# Patient Record
Sex: Female | Born: 1990 | Race: Black or African American | Hispanic: No | Marital: Single | Smoking: Never smoker
Health system: Southern US, Community
[De-identification: ages and names within clinical notes are randomized; demographics above are authoritative.]

## PROBLEM LIST (undated history)

## (undated) HISTORY — PX: TONSILLECTOMY: SUR1361

---
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
## 2021-01-16 ENCOUNTER — Inpatient Hospital Stay (HOSPITAL_COMMUNITY)
Admission: EM | Admit: 2021-01-16 | Discharge: 2021-01-28 | DRG: 060 | Disposition: A | Discharge status: Home / Self Care | Payer: Self-pay | Attending: Internal Medicine | Admitting: Internal Medicine

## 2021-01-16 DIAGNOSIS — L0201 Cutaneous abscess of face: Secondary | ICD-10-CM

## 2021-01-16 DIAGNOSIS — R0602 Shortness of breath: Secondary | ICD-10-CM

## 2021-01-16 DIAGNOSIS — E538 Deficiency of other specified B group vitamins: Secondary | ICD-10-CM | POA: Diagnosis present

## 2021-01-16 DIAGNOSIS — F419 Anxiety disorder, unspecified: Secondary | ICD-10-CM | POA: Diagnosis present

## 2021-01-16 DIAGNOSIS — K0889 Other specified disorders of teeth and supporting structures: Secondary | ICD-10-CM

## 2021-01-16 DIAGNOSIS — E559 Vitamin D deficiency, unspecified: Secondary | ICD-10-CM | POA: Diagnosis present

## 2021-01-16 DIAGNOSIS — Z880 Allergy status to penicillin: Secondary | ICD-10-CM

## 2021-01-16 DIAGNOSIS — G35 Multiple sclerosis: Secondary | ICD-10-CM

## 2021-01-16 DIAGNOSIS — Z87440 Personal history of urinary (tract) infections: Secondary | ICD-10-CM

## 2021-01-16 DIAGNOSIS — R300 Dysuria: Secondary | ICD-10-CM | POA: Diagnosis present

## 2021-01-16 DIAGNOSIS — R350 Frequency of micturition: Secondary | ICD-10-CM | POA: Diagnosis present

## 2021-01-16 DIAGNOSIS — H469 Unspecified optic neuritis: Secondary | ICD-10-CM | POA: Diagnosis present

## 2021-01-16 DIAGNOSIS — Z820 Family history of epilepsy and other diseases of the nervous system: Secondary | ICD-10-CM

## 2021-01-16 DIAGNOSIS — G360 Neuromyelitis optica [Devic]: Principal | ICD-10-CM | POA: Diagnosis present

## 2021-01-16 DIAGNOSIS — M79651 Pain in right thigh: Secondary | ICD-10-CM | POA: Diagnosis present

## 2021-01-16 DIAGNOSIS — Z20822 Contact with and (suspected) exposure to covid-19: Secondary | ICD-10-CM | POA: Diagnosis present

## 2021-01-16 DIAGNOSIS — J029 Acute pharyngitis, unspecified: Secondary | ICD-10-CM | POA: Diagnosis present

## 2021-01-16 DIAGNOSIS — Z114 Encounter for screening for human immunodeficiency virus [HIV]: Secondary | ICD-10-CM

## 2021-01-16 LAB — CBC WITH DIFFERENTIAL/PLATELET
Abs Immature Granulocytes: 0.01 10*3/uL (ref 0.00–0.07)
Basophils Absolute: 0 10*3/uL (ref 0.0–0.1)
Basophils Relative: 1 %
Eosinophils Absolute: 0.1 10*3/uL (ref 0.0–0.5)
Eosinophils Relative: 3 %
HCT: 41.2 % (ref 36.0–46.0)
Hemoglobin: 12.6 g/dL (ref 12.0–15.0)
Immature Granulocytes: 0 %
Lymphocytes Relative: 44 %
Lymphs Abs: 2 10*3/uL (ref 0.7–4.0)
MCH: 25.7 pg — ABNORMAL LOW (ref 26.0–34.0)
MCHC: 30.6 g/dL (ref 30.0–36.0)
MCV: 84.1 fL (ref 80.0–100.0)
Monocytes Absolute: 0.3 10*3/uL (ref 0.1–1.0)
Monocytes Relative: 7 %
Neutro Abs: 2 10*3/uL (ref 1.7–7.7)
Neutrophils Relative %: 45 %
Platelets: 223 10*3/uL (ref 150–400)
RBC: 4.9 MIL/uL (ref 3.87–5.11)
RDW: 13.2 % (ref 11.5–15.5)
WBC: 4.4 10*3/uL (ref 4.0–10.5)
nRBC: 0 % (ref 0.0–0.2)

## 2021-01-16 LAB — COMPREHENSIVE METABOLIC PANEL
ALT: 21 U/L (ref 0–44)
AST: 28 U/L (ref 15–41)
Albumin: 4.2 g/dL (ref 3.5–5.0)
Alkaline Phosphatase: 30 U/L — ABNORMAL LOW (ref 38–126)
Anion gap: 8 (ref 5–15)
BUN: 8 mg/dL (ref 6–20)
CO2: 26 mmol/L (ref 22–32)
Calcium: 9.7 mg/dL (ref 8.9–10.3)
Chloride: 104 mmol/L (ref 98–111)
Creatinine, Ser: 1.1 mg/dL — ABNORMAL HIGH (ref 0.44–1.00)
GFR, Estimated: >60 mL/min (ref >60)
Glucose, Bld: 97 mg/dL (ref 70–99)
Potassium: 3.7 mmol/L (ref 3.5–5.1)
Sodium: 138 mmol/L (ref 135–145)
Total Bilirubin: 1.1 mg/dL (ref 0.3–1.2)
Total Protein: 7.7 g/dL (ref 6.5–8.1)

## 2021-01-16 LAB — I-STAT BETA HCG BLOOD, ED (MC, WL, AP ONLY): I-stat hCG, quantitative: <5 m[IU]/mL (ref <5)

## 2021-01-16 MED ORDER — LORAZEPAM 2 MG/ML IJ SOLN
1.0000 mg | Freq: Once | INTRAMUSCULAR | Status: AC
  Administered 2021-01-17: 1 mg via INTRAVENOUS
  Filled 2021-01-16: qty 1

## 2021-01-16 MED ORDER — TETRACAINE HCL 0.5 % OP SOLN: 2.0000 [drp] | Freq: Once | OPHTHALMIC | Status: DC

## 2021-01-16 NOTE — ED Triage Notes (Signed)
Pt states she has had change in vision in right eye and saw a optometrist who suggested MRI . Has gotten worse today and has pain

## 2021-01-16 NOTE — ED Provider Notes (Signed)
Emergency Medicine Provider Triage Evaluation Note  Danielle York , a 30 y.o. female  was evaluated in triage.  Pt complains of right eye pain and vision loss.  Symptoms began a few days ago.  Saw optometry yesterday who was concerned about optic neuritis, recommended ER evaluation with an MRI.  Today the patient is worse and pain is increased.  No symptoms of the left eye.  No numbness or weakness of the face or extremities.  Associated headache.  No other medical problems.  Review of Systems  Positive: R eye pain and vision loss, HA Negative: Weakness  Physical Exam  BP (!) 158/108 (BP Location: Left Arm)   Pulse (!) 108   Temp 100 F (37.8 C) (Oral)   Resp 20   SpO2 96%  Gen:   Awake, no distress   Resp:  Normal effort  MSK:   Moves extremities without difficulty  Other:  Reports cental vision loss. EOMI and PERRLA  Medical Decision Making  Medically screening exam initiated at 9:43 PM.  Appropriate orders placed.  Danielle York was informed that the remainder of the evaluation will be completed by another provider, this initial triage assessment does not replace that evaluation, and the importance of remaining in the ED until their evaluation is complete.  Labs and MRI ordered   Danielle Apley, PA-C 01/16/21 2146    Danielle Savoy, MD 01/16/21 [phone]

## 2021-01-17 ENCOUNTER — Encounter (HOSPITAL_COMMUNITY): Payer: Self-pay | Admitting: Internal Medicine

## 2021-01-17 ENCOUNTER — Emergency Department (HOSPITAL_COMMUNITY): Payer: Self-pay

## 2021-01-17 ENCOUNTER — Other Ambulatory Visit: Payer: Self-pay

## 2021-01-17 ENCOUNTER — Inpatient Hospital Stay (HOSPITAL_COMMUNITY): Payer: Self-pay

## 2021-01-17 DIAGNOSIS — H53131 Sudden visual loss, right eye: Secondary | ICD-10-CM

## 2021-01-17 DIAGNOSIS — R300 Dysuria: Secondary | ICD-10-CM

## 2021-01-17 DIAGNOSIS — H5711 Ocular pain, right eye: Secondary | ICD-10-CM

## 2021-01-17 DIAGNOSIS — H469 Unspecified optic neuritis: Secondary | ICD-10-CM

## 2021-01-17 LAB — VITAMIN B12: Vitamin B-12: 408 pg/mL (ref 180–914)

## 2021-01-17 LAB — SARS CORONAVIRUS 2 (TAT 6-24 HRS): SARS Coronavirus 2: NEGATIVE

## 2021-01-17 LAB — HIV ANTIBODY (ROUTINE TESTING W REFLEX): HIV Screen 4th Generation wRfx: NONREACTIVE

## 2021-01-17 IMAGING — MR MR ORBITS WO/W CM
5 of 8 series · 24 of 48 positions shown · IV contrast (gadavist)
Comparison: None available.

CLINICAL DATA: Initial evaluation for acute right-sided visual
disturbance, suspected optic neuritis.

EXAM:
MRI HEAD AND ORBITS WITHOUT AND WITH CONTRAST
TECHNIQUE: Multiplanar, multiecho pulse sequences of the brain and surrounding
structures were obtained without and with intravenous contrast.
Multiplanar, multiecho pulse sequences of the orbits and surrounding
structures were obtained including fat saturation techniques, before
and after intravenous contrast administration.
CONTRAST:  5mL GADAVIST GADOBUTROL 1 MMOL/ML IV SOLN

[Series 1: T1 · axial · non-contrast · 3.0mm · 0.37mm/px · z∈[-143,-91]mm · 4 of 15 slices shown (1 of 2)]
[im 1/15]
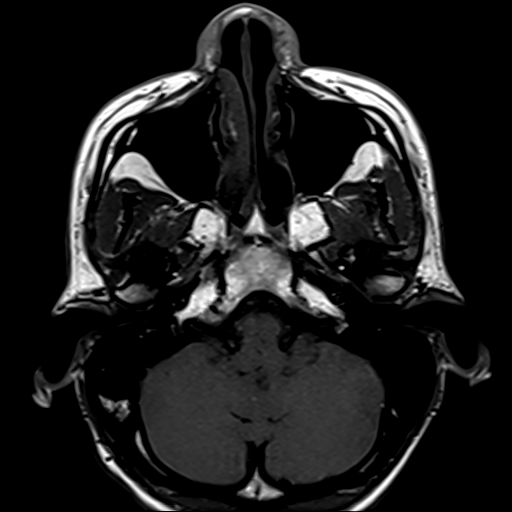
[im 5/15]
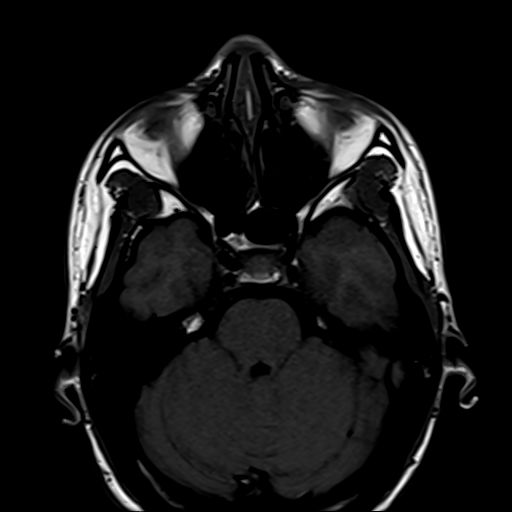
[im 10/15]
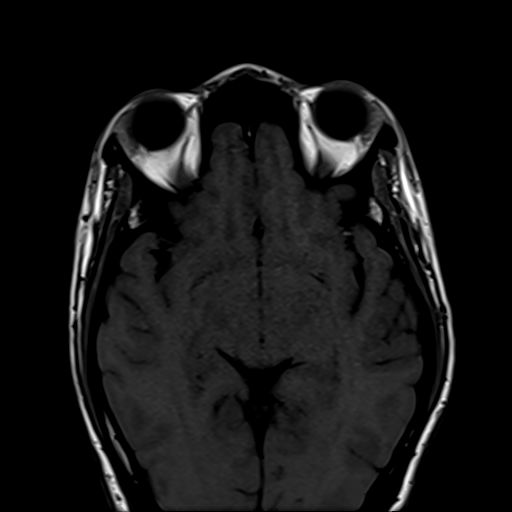
[im 15/15]
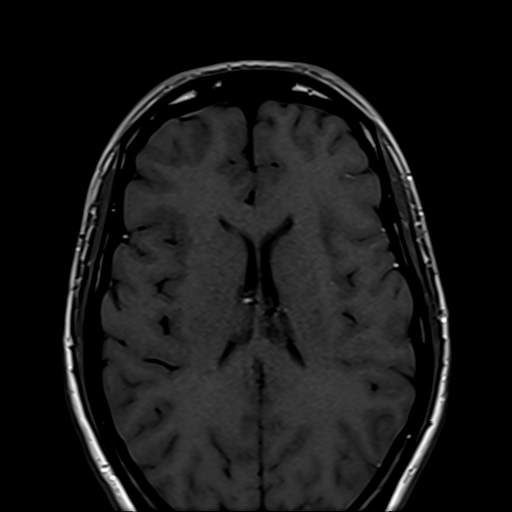

[Series 2: T2 fat-sat · axial · 3.0mm · 0.59mm/px · z∈[-143,-91]mm · 4 of 15 slices shown (1 of 2)]
[im 1/15]
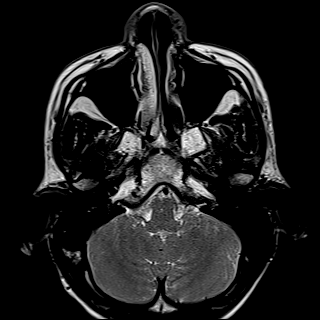
[im 5/15]
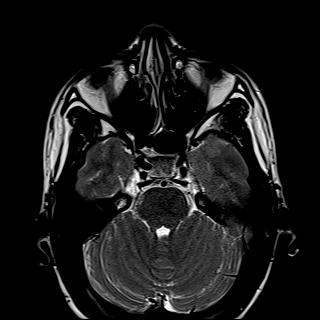
[im 10/15]
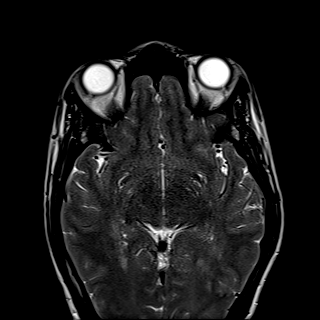
[im 15/15]
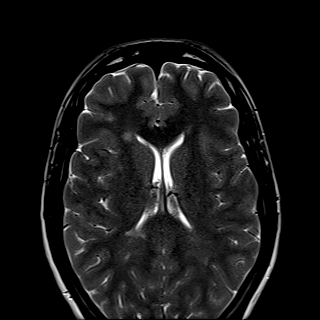

[Series 5: T2 fat-sat · coronal · 3.0mm · 0.74mm/px · 6 of 27 slices shown (2 of 2)]
[im 1/27]
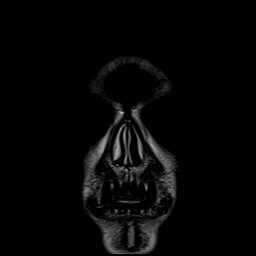
[im 6/27]
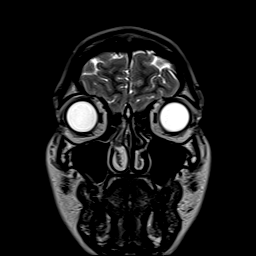
[im 11/27]
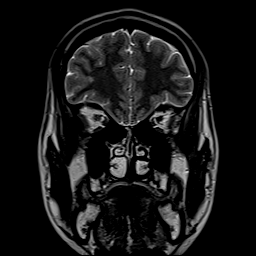
[im 16/27]
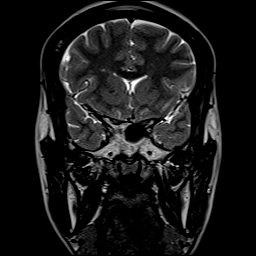
[im 21/27]
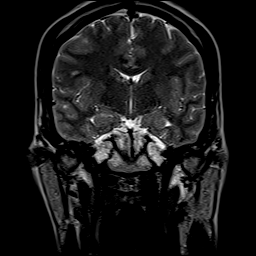
[im 27/27]
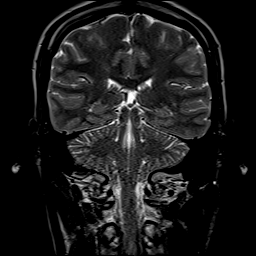

[Series 8: T1 · coronal · 3.0mm · 0.37mm/px · 3 of 27 slices shown (2 of 2)]
[im 1/27]
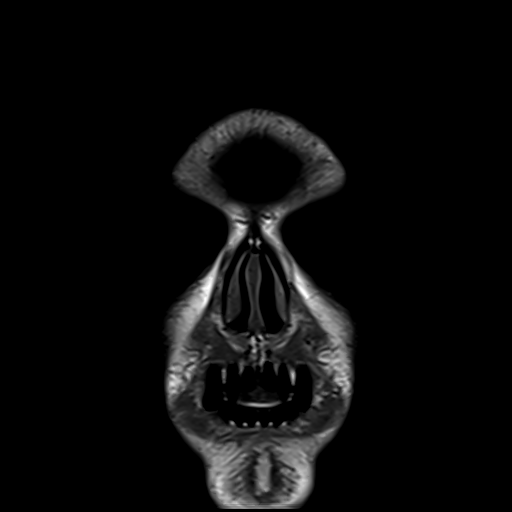
[im 6/27]
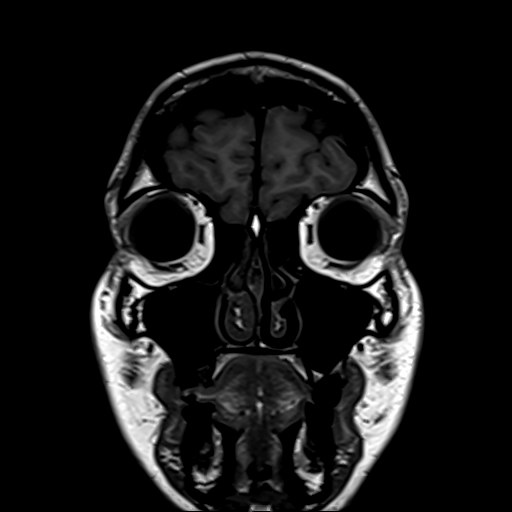
[im 11/27]
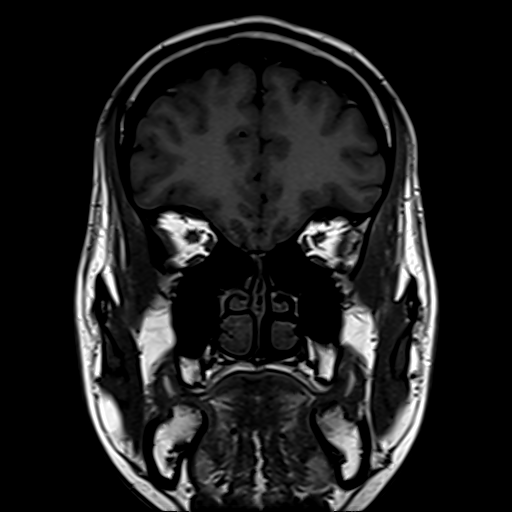

[Series 12: T1 post-contrast · coronal · 5.0mm · 0.43mm/px · 7 of 28 slices shown]
[im 1/28]
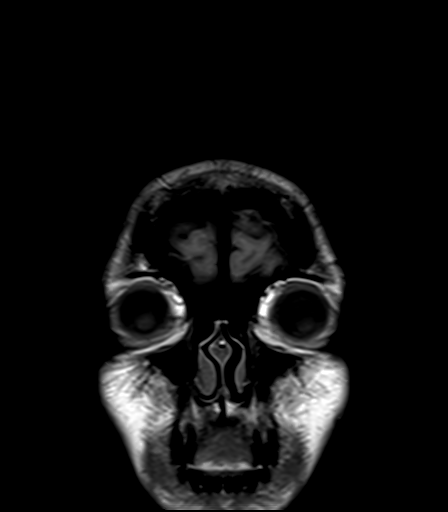
[im 5/28]
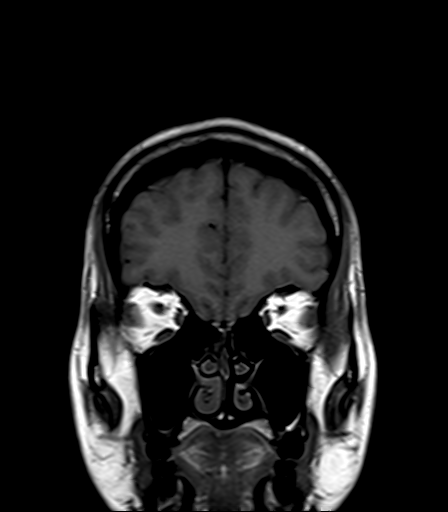
[im 10/28]
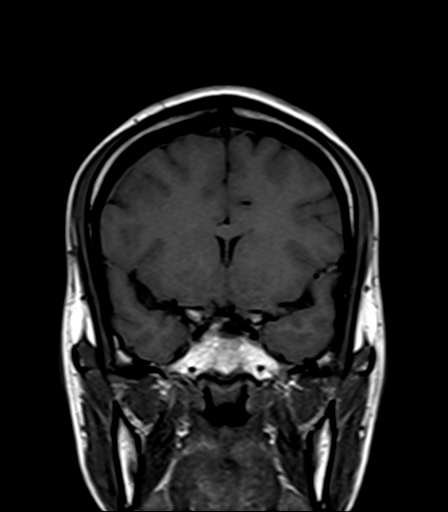
[im 14/28]
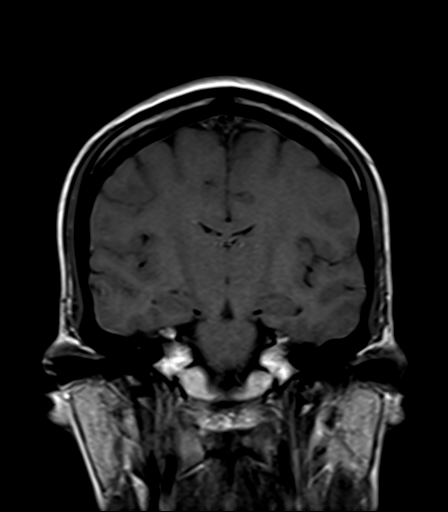
[im 19/28]
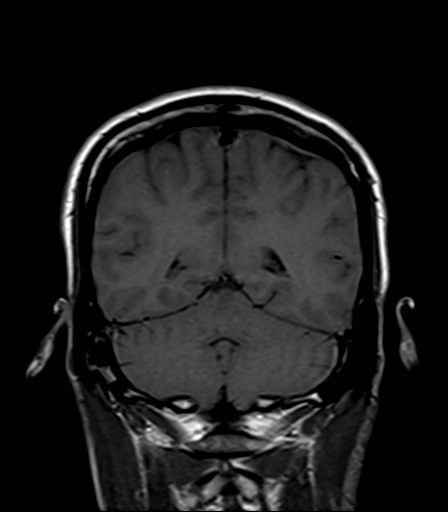
[im 23/28]
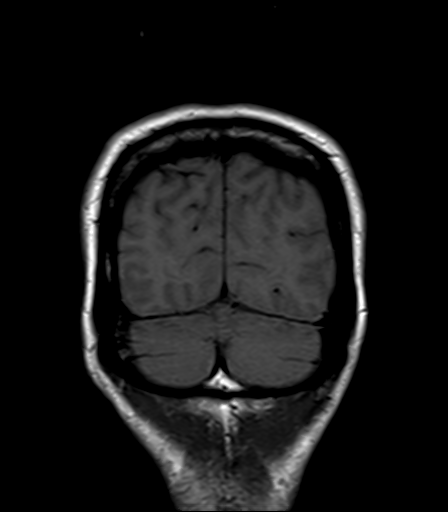
[im 28/28]
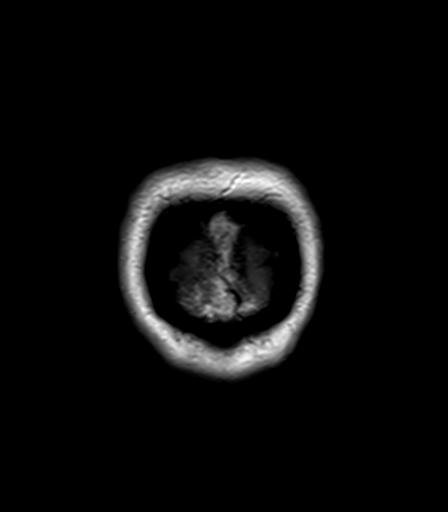

[24 of 48 positions shown; findings below may reference images not displayed]

FINDINGS: MRI HEAD FINDINGS

Brain: Examination is somewhat technically limited as while the
patient did receive IV contrast material for this exam, little to no
contrast material is seen within the brain on postcontrast
sequences, limiting assessment.

Cerebral volume within normal limits. There are scattered patchy
T2/FLAIR hyperintensities involving the periventricular and deep
white matter of both cerebral hemispheres. A few probable scattered
subtle juxtacortical lesions noted as well. Several of these foci
radiate from the lateral ventricles in a perpendicular fashion. A
few infratentorial lesions involving the left dorsal midbrain and
right pons noted (series 12, images 9, 7). For reference purposes,
the largest discrete lesion seen at the deep white matter of the
right frontal centrum semi ovale and measures 1 cm (series 12, image
15). Multiple corresponding T1 black holes. Findings are most
consistent with demyelinating disease/multiple sclerosis. Several of
these lesions demonstrate associated T2 shine through, with no
definite restricted diffusion to suggest active demyelination.
Again, evaluation for associated enhancement is fairly limited on
this exam as little to no contrast material is on board for
postcontrast sequences.

No evidence for acute or subacute infarct. Gray-white matter
differentiation maintained. No encephalomalacia to suggest chronic
cortical infarction. No foci of susceptibility artifact to suggest
acute or chronic intracranial hemorrhage.

No mass lesion, midline shift or mass effect. No hydrocephalus or
extra-axial fluid collection. Pituitary gland suprasellar region
normal. Midline structures intact.

Vascular: Major intracranial vascular flow voids are maintained.

Skull and upper cervical spine: Craniocervical junction within
normal limits. Bone marrow signal intensity normal. No scalp soft
tissue abnormality.

Other: No mastoid effusion.  Inner ear structures grossly normal.

MRI ORBITS FINDINGS

Orbits: Globes are symmetric in size with normal morphology
bilaterally. While evaluation for possible optic neuritis is limited
given the relative lack of IV contrast on this exam, there is subtle
asymmetric enlargement of the right optic nerve as compared to the
left. Additionally, the nerve and/or nerve sheath itself appears
somewhat fuzzy and irregular as compared to the contralateral left
nerve (series 2, image 8). Findings also seen on corresponding
coronal sequence (series 5, image 18). Given the patient history and
corresponding findings in the brain, findings are highly suspicious
for acute optic neuritis. No abnormality seen about the orbital
apices. Optic chiasm normally situated within the suprasellar
cistern without abnormality. No sellar or suprasellar mass.

Extra-ocular muscles symmetric and within normal limits. Lacrimal
glands normal. Superior orbital veins symmetric and within normal
limits. No abnormality about the cavernous sinus.

Visualized sinuses: Visualized paranasal sinuses are clear.

Soft tissues: Unremarkable.
IMPRESSION: 1. Technically limited exam as while contrast was administered for
this study, little to no contrast is seen onboard on postcontrast
sequences, somewhat limiting assessment.
2. Patchy T2/FLAIR hyperintensities involving the supratentorial and
infratentorial cerebral white matter as above, most consistent with
demyelinating disease/multiple sclerosis. No definite evidence for
active demyelination on this limited study.
3. Subtle asymmetric enlargement and irregularity of the right optic
nerve as above, highly suspicious for acute optic neuritis,
particularly given the patient history and concomitant findings
within the brain.

## 2021-01-17 IMAGING — MR MR HEAD WO/W CM
12 of 16 series · 31 of 48 positions shown · IV contrast (gadavist)
Comparison: None available.

CLINICAL DATA: Initial evaluation for acute right-sided visual
disturbance, suspected optic neuritis.

EXAM:
MRI HEAD AND ORBITS WITHOUT AND WITH CONTRAST
TECHNIQUE: Multiplanar, multiecho pulse sequences of the brain and surrounding
structures were obtained without and with intravenous contrast.
Multiplanar, multiecho pulse sequences of the orbits and surrounding
structures were obtained including fat saturation techniques, before
and after intravenous contrast administration.
CONTRAST:  5mL GADAVIST GADOBUTROL 1 MMOL/ML IV SOLN

[Series 5: DWI · axial · 3.0mm · 0.88mm/px · z∈[-151,-21]mm · 2 of 48 slices shown (1 of 4)]
[im 1/48]
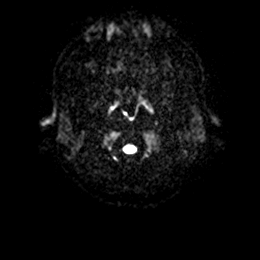
[im 48/48]
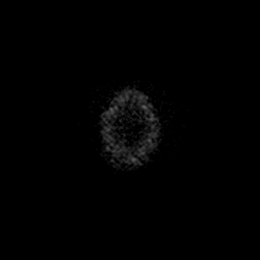

[Series 6: DWI · axial · 3.0mm · 0.88mm/px · z∈[-151,-21]mm · 2 of 48 slices shown (2 of 4)]
[im 1/48]
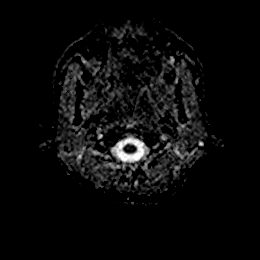
[im 48/48]
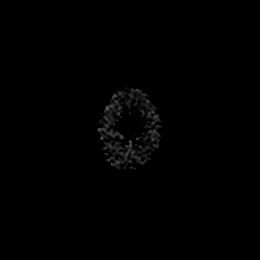

[Series 7: DWI · coronal · 4.0mm · 0.88mm/px · 2 of 36 slices shown (3 of 4)]
[im 1/36]
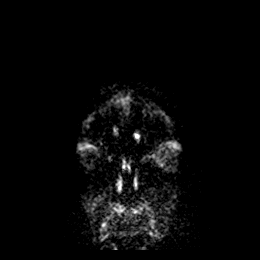
[im 36/36]
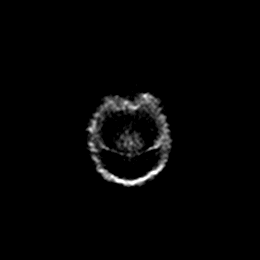

[Series 8: DWI · coronal · 4.0mm · 0.88mm/px · 2 of 36 slices shown (4 of 4)]
[im 1/36]
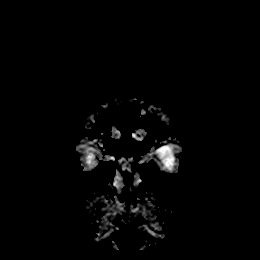
[im 36/36]
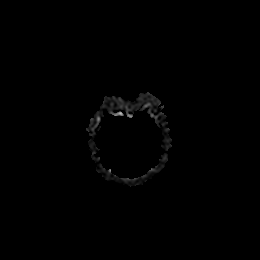

[Series 9: T1 · sagittal · 5.0mm · 0.94mm/px · 2 of 23 slices shown]
[im 1/23]
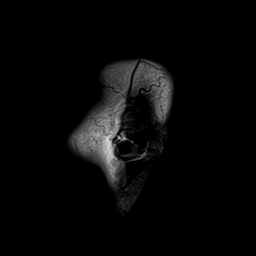
[im 23/23]
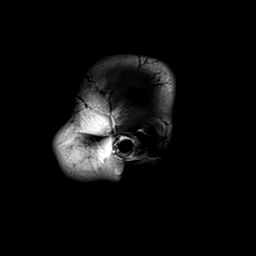

[Series 10: t2_space_dark-[id number]_ns-ir · sagittal · 1.0mm · 0.49mm/px · 8 of 128 slices shown]
[im 1/128]
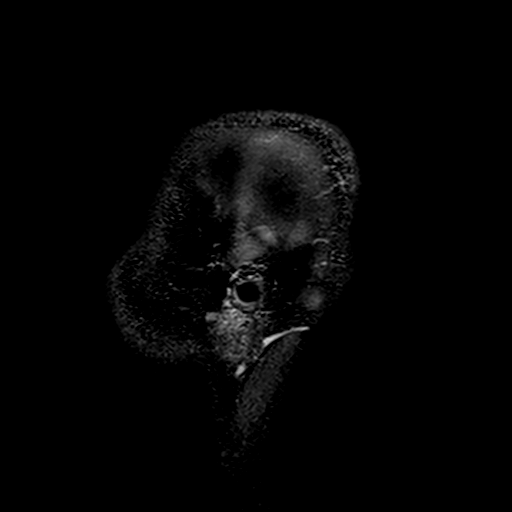
[im 16/128]
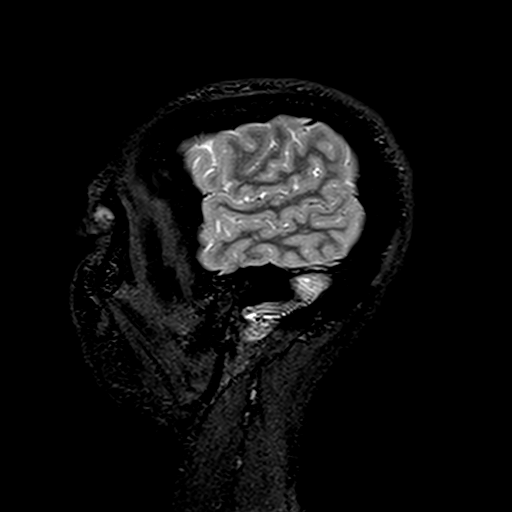
[im 32/128]
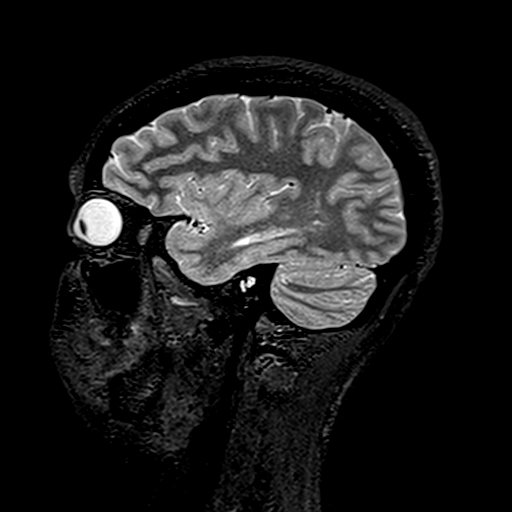
[im 48/128]
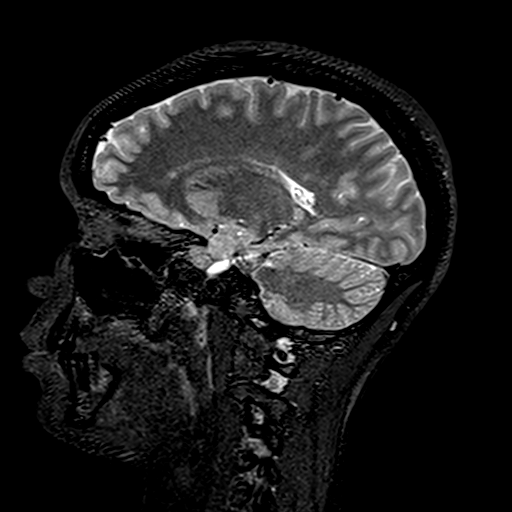
[im 80/128]
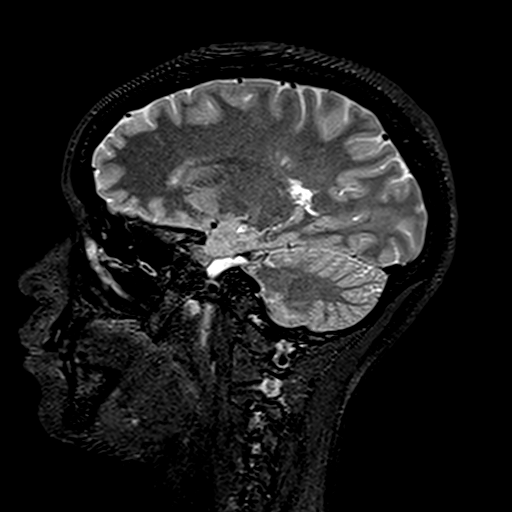
[im 96/128]
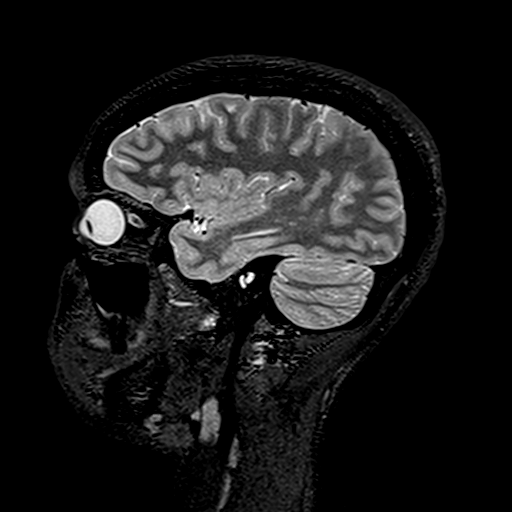
[im 112/128]
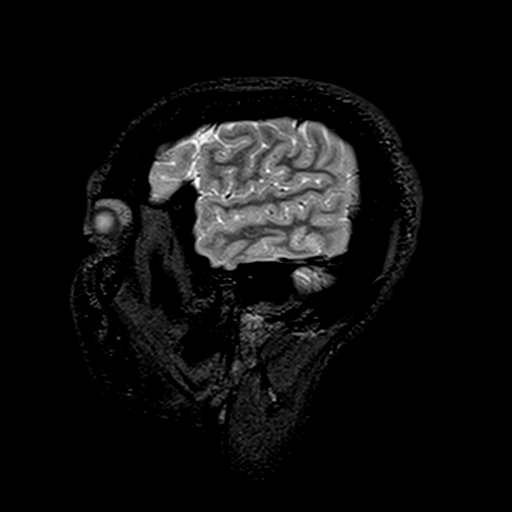
[im 128/128]
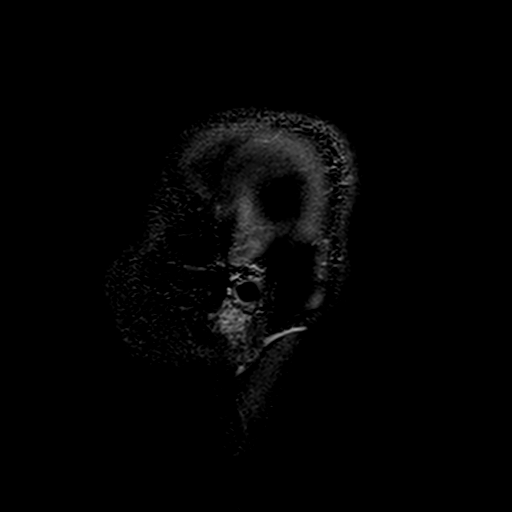

[Series 11: T2 · axial · 5.0mm · 0.90mm/px · z∈[-152,-19]mm · 2 of 25 slices shown]
[im 1/25]
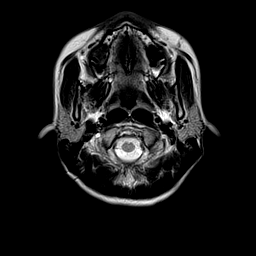
[im 25/25]
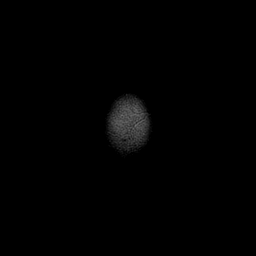

[Series 12: FLAIR · axial · 5.0mm · 0.45mm/px · z∈[-153,-19]mm · 2 of 25 slices shown]
[im 1/25]
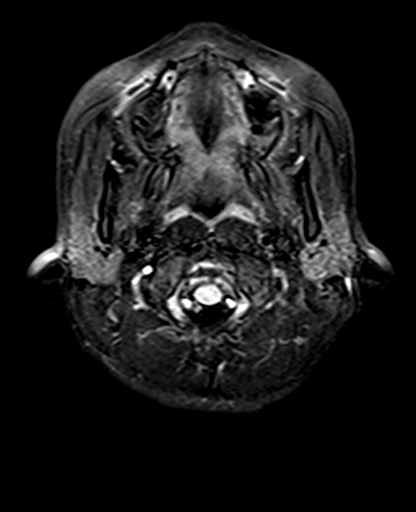
[im 25/25]
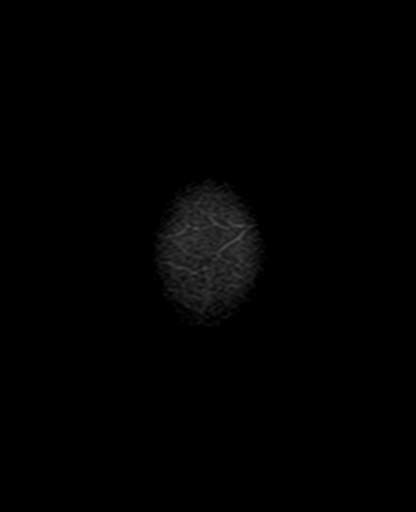

[Series 13: mag_images · axial · 3.0mm · 0.90mm/px · z∈[-157,-62]mm · 3 of 52 slices shown]
[im 1/52]
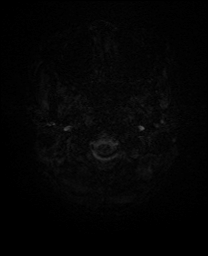
[im 18/52]
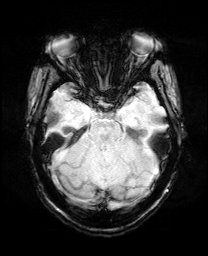
[im 35/52]
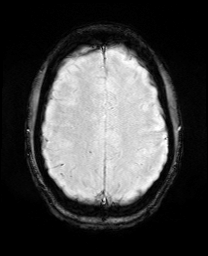

[Series 18: T2 post-contrast · coronal · 5.0mm · 0.90mm/px · 2 of 28 slices shown]
[im 1/28]
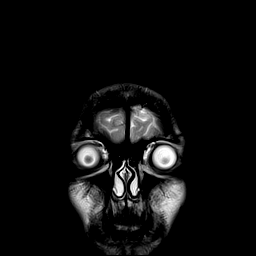
[im 28/28]
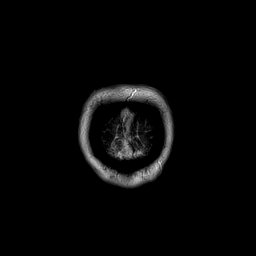

[Series 20: T1 post-contrast · coronal · 5.0mm · 0.43mm/px · 2 of 28 slices shown (1 of 2)]
[im 1/28]
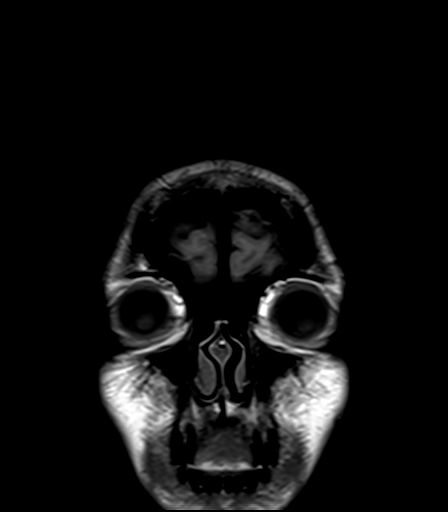
[im 28/28]
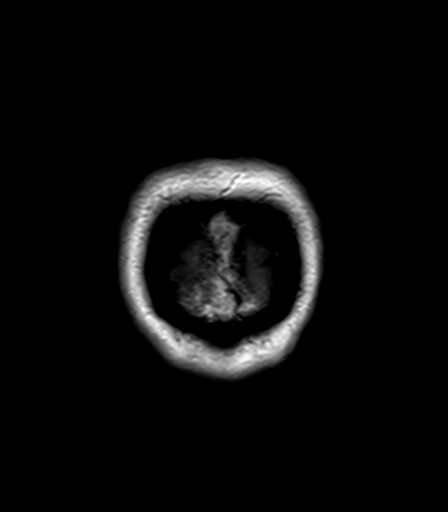

[Series 21: T1 post-contrast · sagittal · 4.0mm · 0.90mm/px · 2 of 31 slices shown (2 of 2)]
[im 1/31]
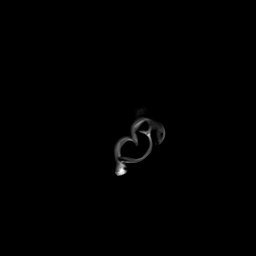
[im 31/31]
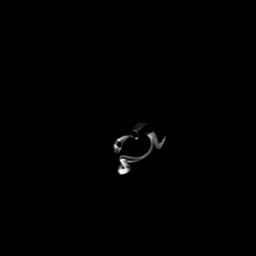

[31 of 48 positions shown; findings below may reference images not displayed]

FINDINGS: MRI HEAD FINDINGS

Brain: Examination is somewhat technically limited as while the
patient did receive IV contrast material for this exam, little to no
contrast material is seen within the brain on postcontrast
sequences, limiting assessment.

Cerebral volume within normal limits. There are scattered patchy
T2/FLAIR hyperintensities involving the periventricular and deep
white matter of both cerebral hemispheres. A few probable scattered
subtle juxtacortical lesions noted as well. Several of these foci
radiate from the lateral ventricles in a perpendicular fashion. A
few infratentorial lesions involving the left dorsal midbrain and
right pons noted (series 12, images 9, 7). For reference purposes,
the largest discrete lesion seen at the deep white matter of the
right frontal centrum semi ovale and measures 1 cm (series 12, image
15). Multiple corresponding T1 black holes. Findings are most
consistent with demyelinating disease/multiple sclerosis. Several of
these lesions demonstrate associated T2 shine through, with no
definite restricted diffusion to suggest active demyelination.
Again, evaluation for associated enhancement is fairly limited on
this exam as little to no contrast material is on board for
postcontrast sequences.

No evidence for acute or subacute infarct. Gray-white matter
differentiation maintained. No encephalomalacia to suggest chronic
cortical infarction. No foci of susceptibility artifact to suggest
acute or chronic intracranial hemorrhage.

No mass lesion, midline shift or mass effect. No hydrocephalus or
extra-axial fluid collection. Pituitary gland suprasellar region
normal. Midline structures intact.

Vascular: Major intracranial vascular flow voids are maintained.

Skull and upper cervical spine: Craniocervical junction within
normal limits. Bone marrow signal intensity normal. No scalp soft
tissue abnormality.

Other: No mastoid effusion.  Inner ear structures grossly normal.

MRI ORBITS FINDINGS

Orbits: Globes are symmetric in size with normal morphology
bilaterally. While evaluation for possible optic neuritis is limited
given the relative lack of IV contrast on this exam, there is subtle
asymmetric enlargement of the right optic nerve as compared to the
left. Additionally, the nerve and/or nerve sheath itself appears
somewhat fuzzy and irregular as compared to the contralateral left
nerve (series 2, image 8). Findings also seen on corresponding
coronal sequence (series 5, image 18). Given the patient history and
corresponding findings in the brain, findings are highly suspicious
for acute optic neuritis. No abnormality seen about the orbital
apices. Optic chiasm normally situated within the suprasellar
cistern without abnormality. No sellar or suprasellar mass.

Extra-ocular muscles symmetric and within normal limits. Lacrimal
glands normal. Superior orbital veins symmetric and within normal
limits. No abnormality about the cavernous sinus.

Visualized sinuses: Visualized paranasal sinuses are clear.

Soft tissues: Unremarkable.
IMPRESSION: 1. Technically limited exam as while contrast was administered for
this study, little to no contrast is seen onboard on postcontrast
sequences, somewhat limiting assessment.
2. Patchy T2/FLAIR hyperintensities involving the supratentorial and
infratentorial cerebral white matter as above, most consistent with
demyelinating disease/multiple sclerosis. No definite evidence for
active demyelination on this limited study.
3. Subtle asymmetric enlargement and irregularity of the right optic
nerve as above, highly suspicious for acute optic neuritis,
particularly given the patient history and concomitant findings
within the brain.

## 2021-01-17 IMAGING — MR MR HEAD W/ CM
4 of 5 series · 18 of 48 positions shown · IV contrast (gadavist)
Comparison: Same day MRI head/orbits.

CLINICAL DATA: Vision loss, monocular.

EXAM:
MRI HEAD AND ORBITS WITH CONTRAST
TECHNIQUE: Multiplanar, multiecho pulse sequences of the brain and surrounding
structures were obtained with intravenous contrast. Multiplanar,
multiecho pulse sequences of the orbits and surrounding structures
were obtained including fat saturation techniques, after intravenous
contrast administration.
CONTRAST:  5mL GADAVIST GADOBUTROL 1 MMOL/ML IV SOLN

[Series 6: T1 post-contrast · coronal · 5.0mm · 0.34mm/px · 9 of 28 slices shown (1 of 2)]
[im 1/28]
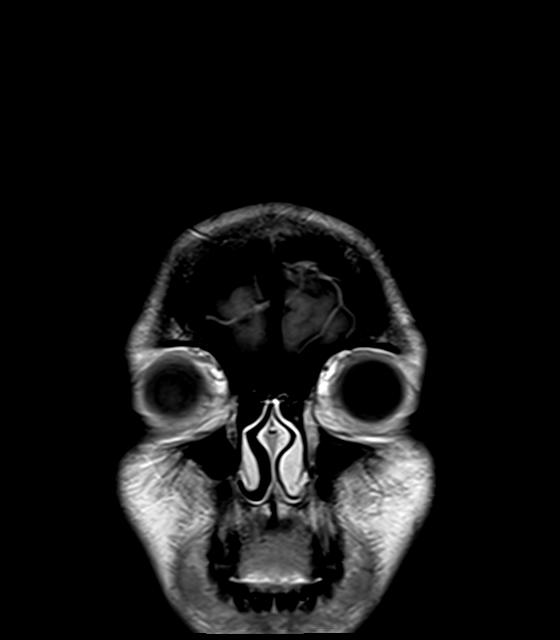
[im 4/28]
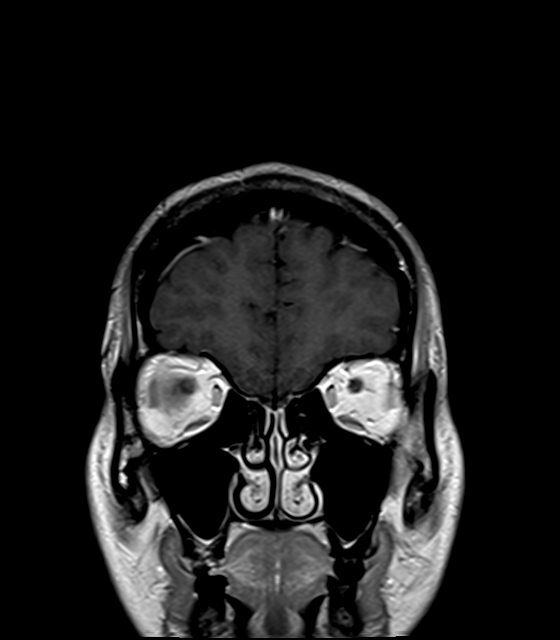
[im 7/28]
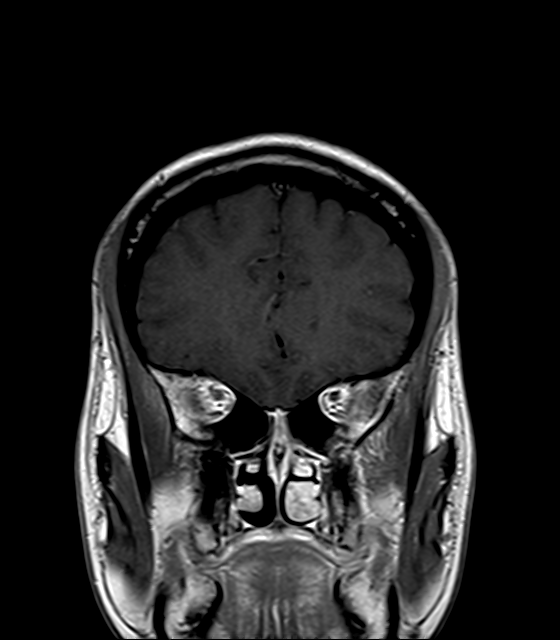
[im 11/28]
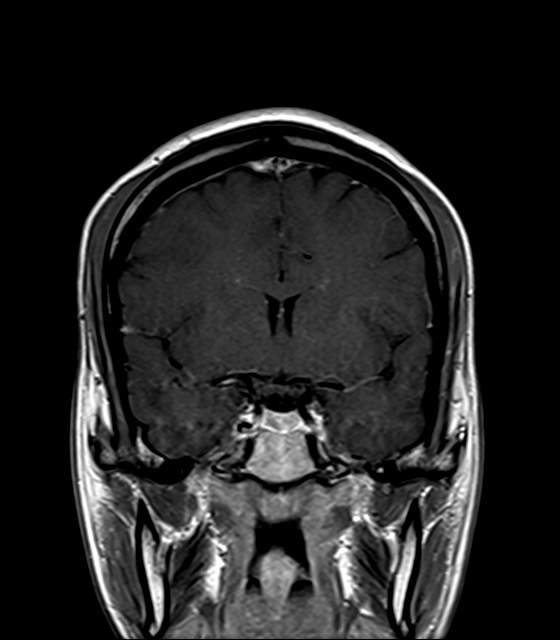
[im 14/28]
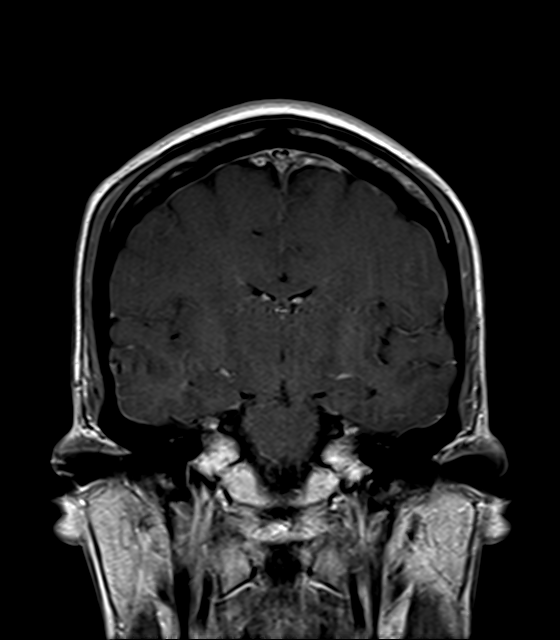
[im 17/28]
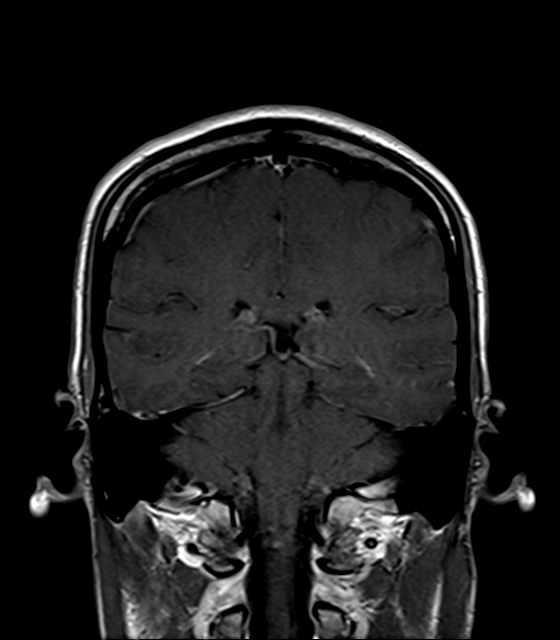
[im 21/28]
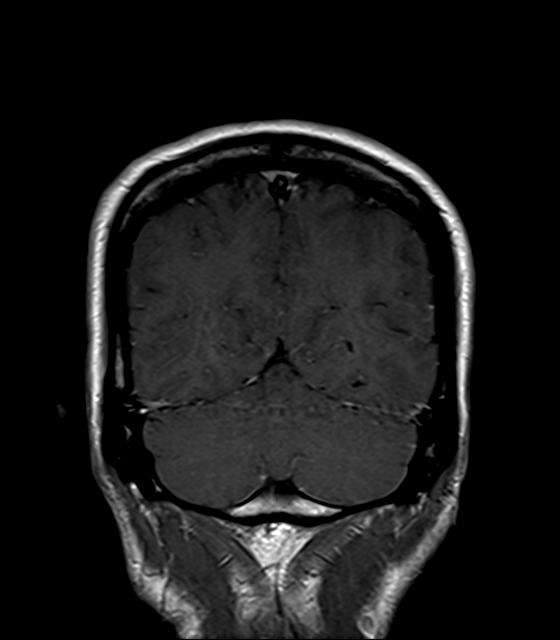
[im 24/28]
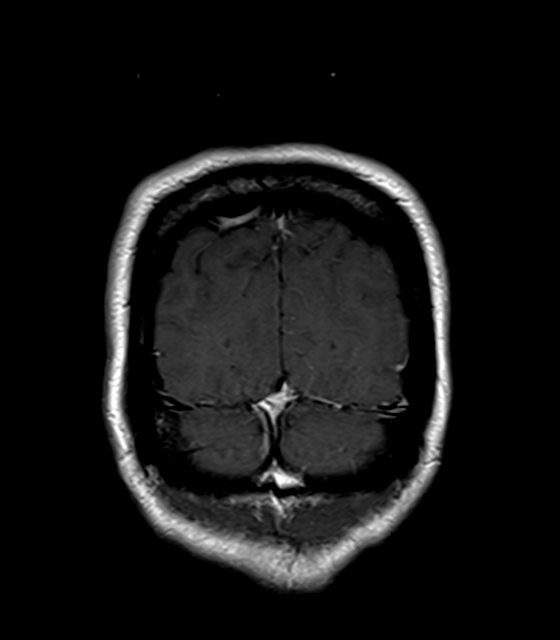
[im 28/28]
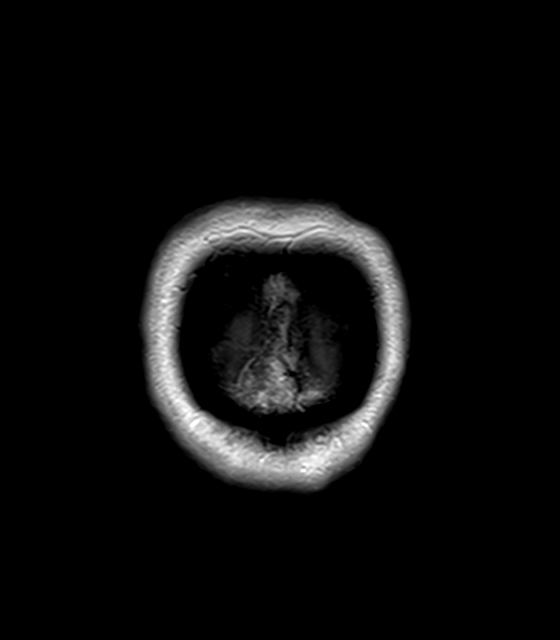

[Series 7: T1 post-contrast · sagittal · 5.0mm · 0.72mm/px · 3 of 23 slices shown (2 of 2)]
[im 4/23]
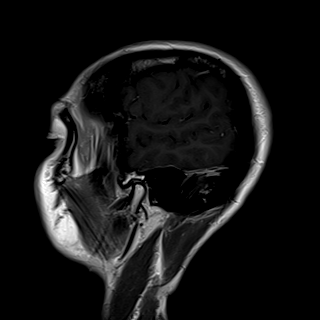
[im 12/23]
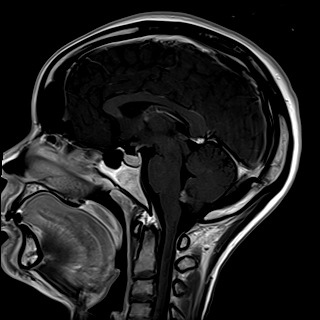
[im 19/23]
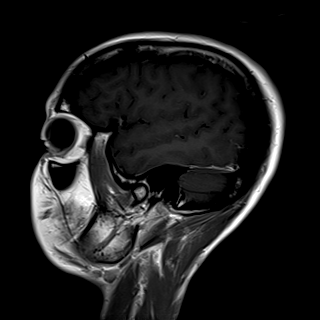

[Series 8: T1 fat-sat post-contrast · axial · 3.0mm · 0.37mm/px · z∈[-108,-56]mm · 3 of 18 slices shown (1 of 2)]
[im 4/18]
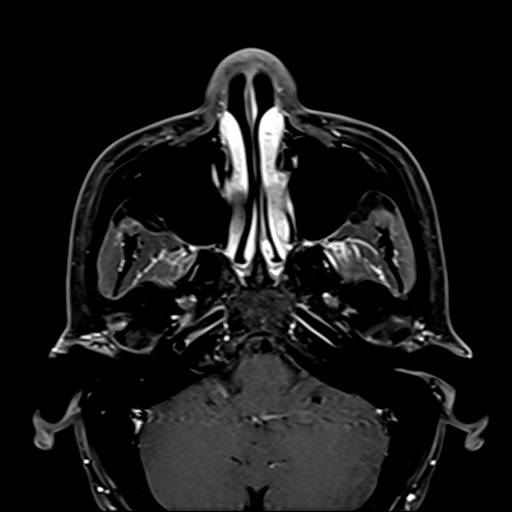
[im 11/18]
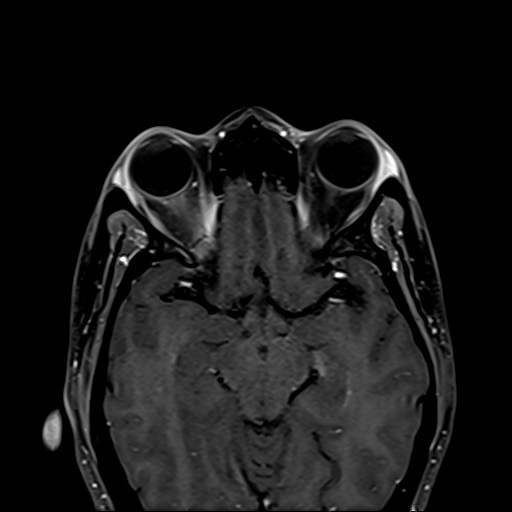
[im 18/18]
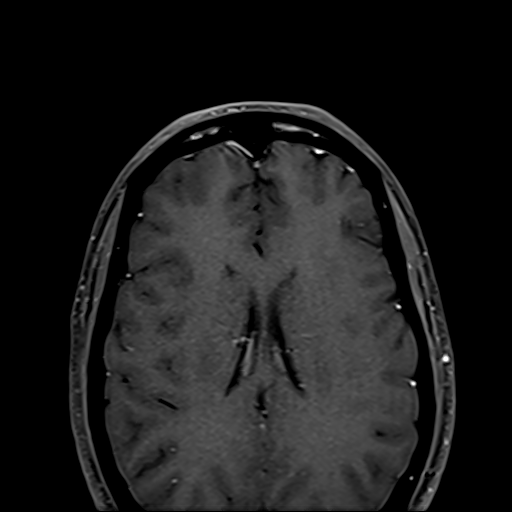

[Series 9: T1 fat-sat post-contrast · coronal · 3.0mm · 0.37mm/px · 3 of 25 slices shown (2 of 2)]
[im 4/25]
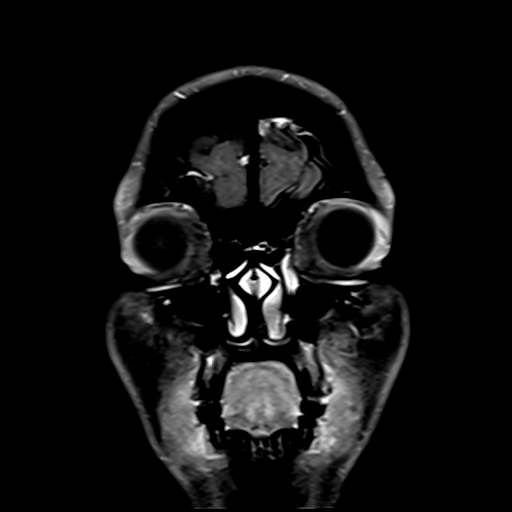
[im 14/25]
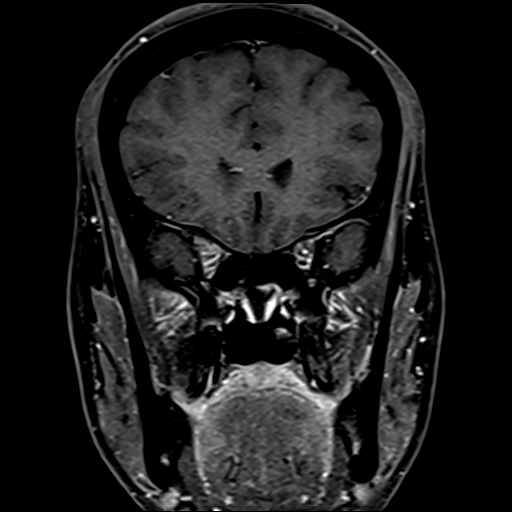
[im 21/25]
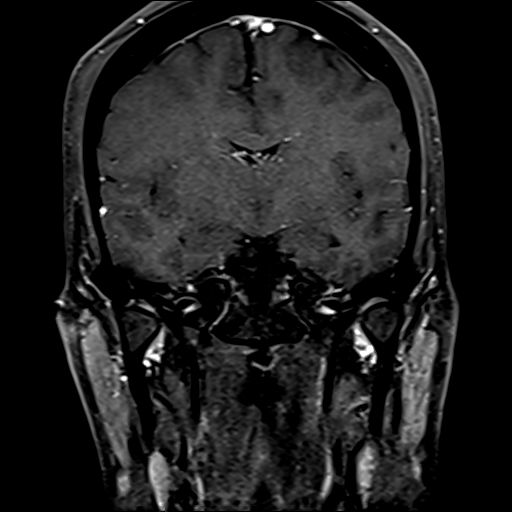

[18 of 48 positions shown; findings below may reference images not displayed]

FINDINGS: Postcontrast imaging was performed to further evaluate findings on
same day MRI due to poor contrast bolus timing on that study. There
is asymmetric enhancement of the prechiasmatic, intracanalicular and
intraorbital right optic nerve. The optic chiasm and left optic
nerves do not appear to enhance. There also appears to be mild
enhancement of the optic nerve sheath in these regions. No masslike
enhancement. As mentioned on same day MRI, the right optic nerve is
mildly enlarged diffusely relative to the left. Surrounding intra
orbital fat appears somewhat fuzzy on the right.

Question faint enhancement of a small right periventricular lesion
(series 7, image 9). Otherwise, no abnormal enhancement outside of
the orbits/optic nerves.
IMPRESSION: 1. Postcontrast imaging of the head and orbits was performed to
further evaluate finding seen on same day MRI. There is asymmetric
enhancement and mild enlargement of the prechiasmatic,
intracanalicular, and intraorbital right optic nerve, favored to
represent optic neuritis from demyelination given white matter
lesions suggestive of demyelination on the same day MRI. Idiopathic
perineuritis (pseudotumor) or infectious optic neuritis are thought
less likely.
2. Question faint enhancement of a small right periventricular
lesion (series 7, image 9), which may represent an area of active
demyelination. Otherwise, no abnormal enhancement outside of the
orbits/optic nerves.

## 2021-01-17 IMAGING — MR MR ORBITS W/ CM
4 of 6 series · 20 of 48 positions shown · IV contrast (gadavist)
Comparison: Same day MRI head/orbits.

CLINICAL DATA: Vision loss, monocular.

EXAM:
MRI HEAD AND ORBITS WITH CONTRAST
TECHNIQUE: Multiplanar, multiecho pulse sequences of the brain and surrounding
structures were obtained with intravenous contrast. Multiplanar,
multiecho pulse sequences of the orbits and surrounding structures
were obtained including fat saturation techniques, after intravenous
contrast administration.
CONTRAST:  5mL GADAVIST GADOBUTROL 1 MMOL/ML IV SOLN

[Series 6: T1 post-contrast · coronal · 5.0mm · 0.34mm/px · 8 of 28 slices shown (1 of 2)]
[im 1/28]
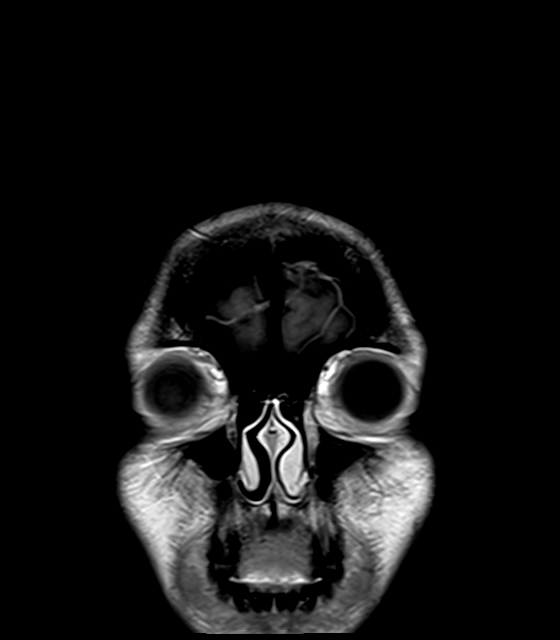
[im 4/28]
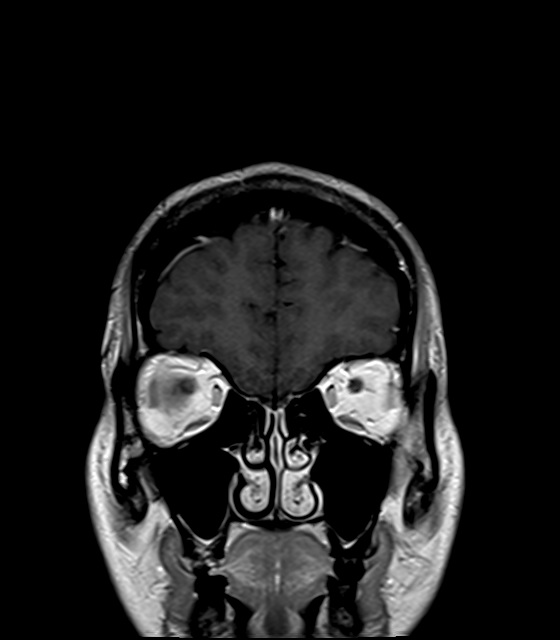
[im 8/28]
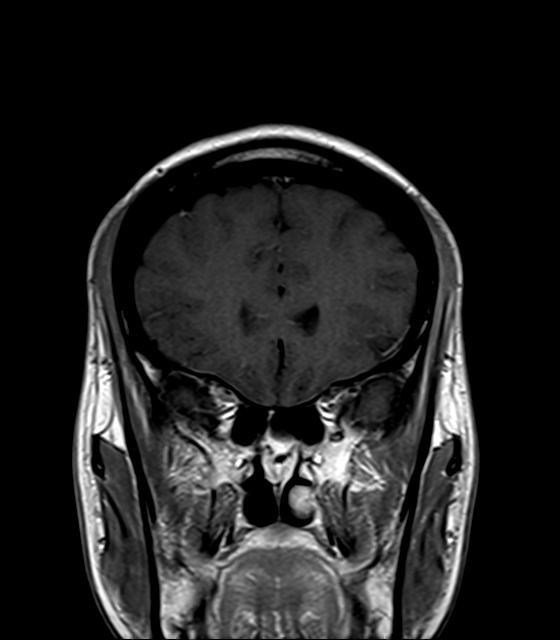
[im 12/28]
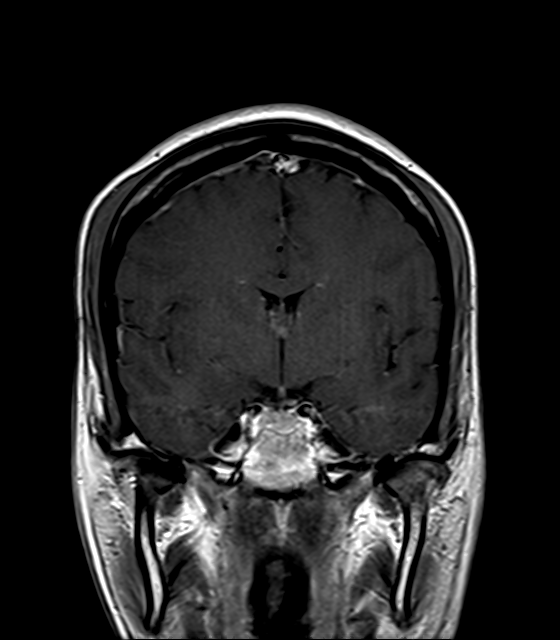
[im 16/28]
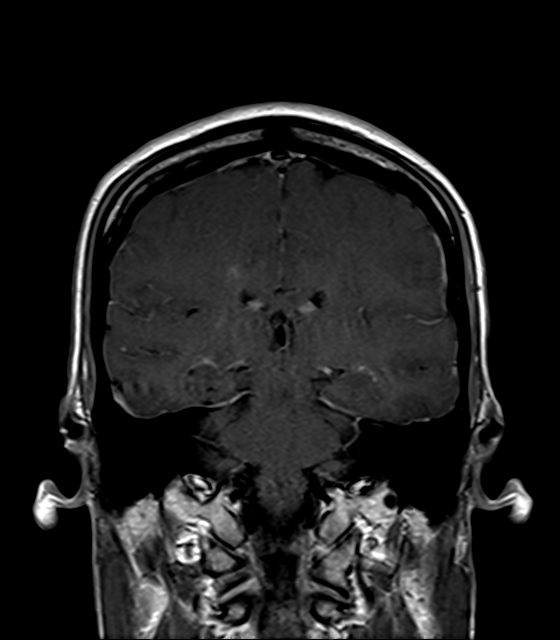
[im 20/28]
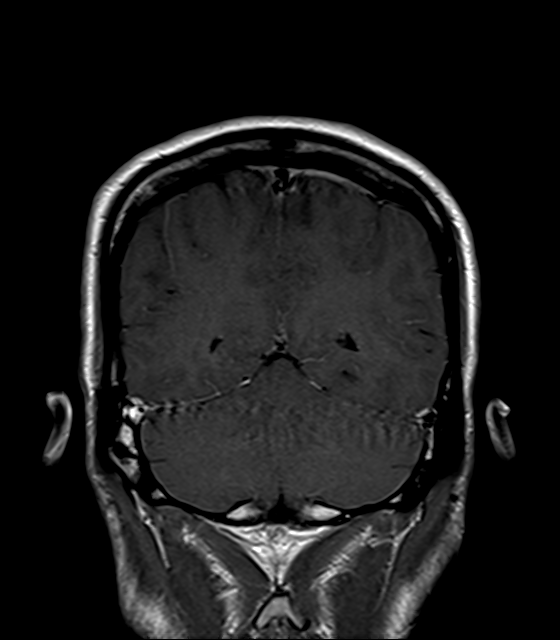
[im 24/28]
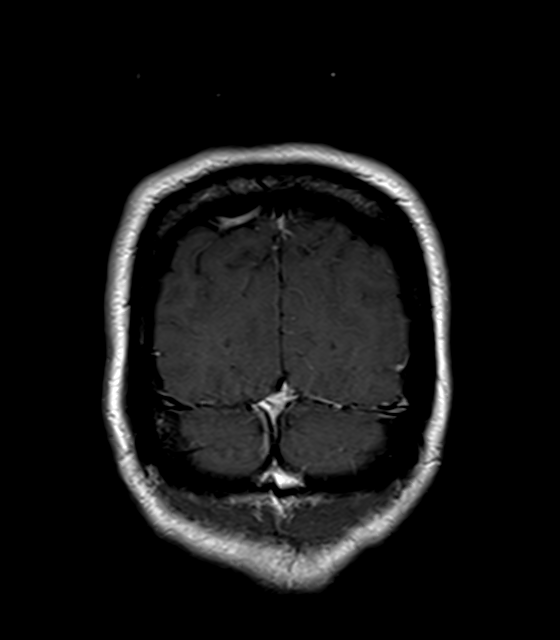
[im 28/28]
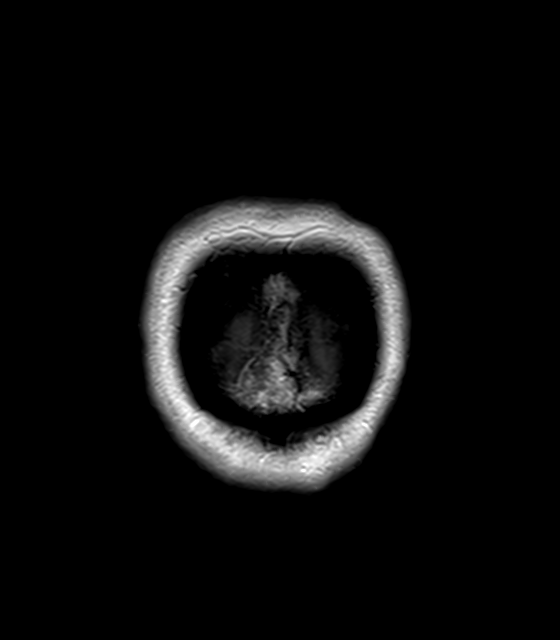

[Series 7: T1 post-contrast · sagittal · 5.0mm · 0.72mm/px · 6 of 23 slices shown (2 of 2)]
[im 1/23]
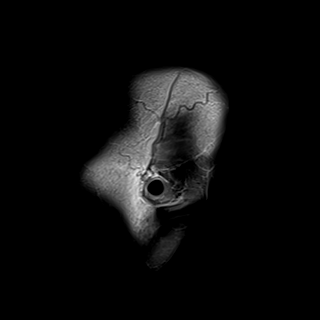
[im 5/23]
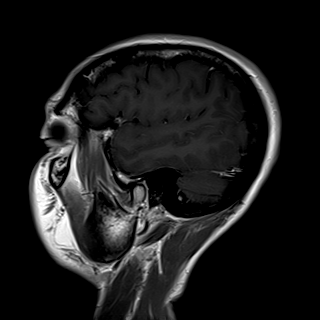
[im 9/23]
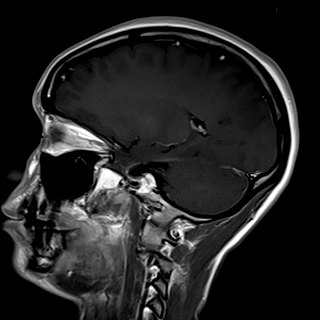
[im 14/23]
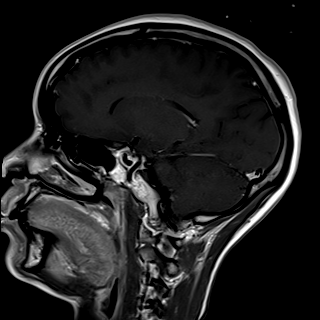
[im 18/23]
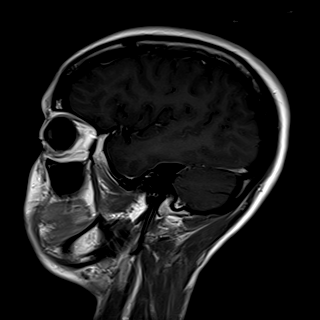
[im 23/23]
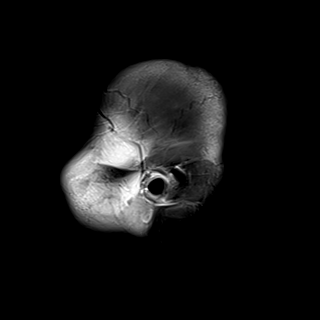

[Series 8: T1 fat-sat post-contrast · axial · 3.0mm · 0.37mm/px · z∈[-119,-56]mm · 3 of 18 slices shown (1 of 2)]
[im 1/18]
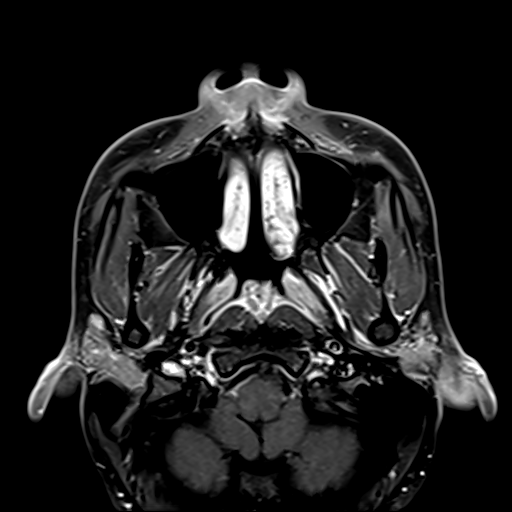
[im 9/18]
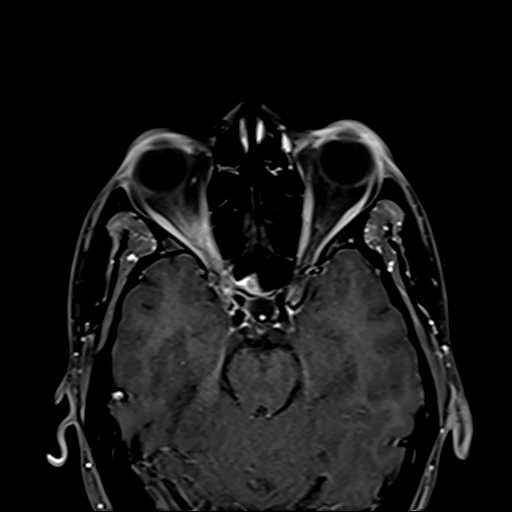
[im 18/18]
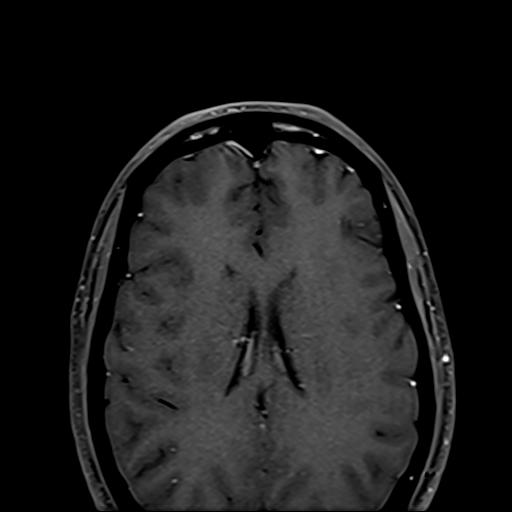

[Series 8: T1 fat-sat post-contrast · axial · 3.0mm · 0.37mm/px · z∈[-119,-56]mm · 3 of 18 slices shown (2 of 2)]
[im 1/18]
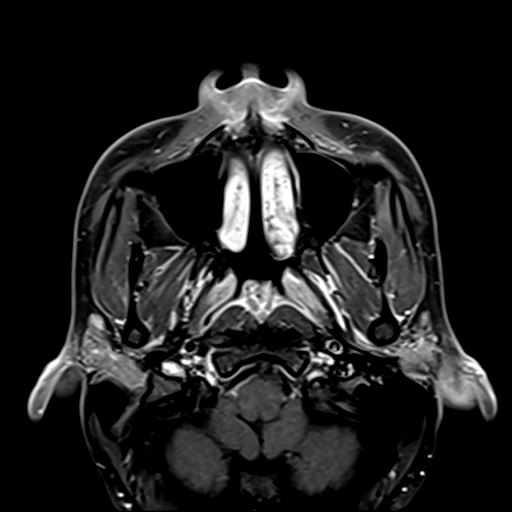
[im 9/18]
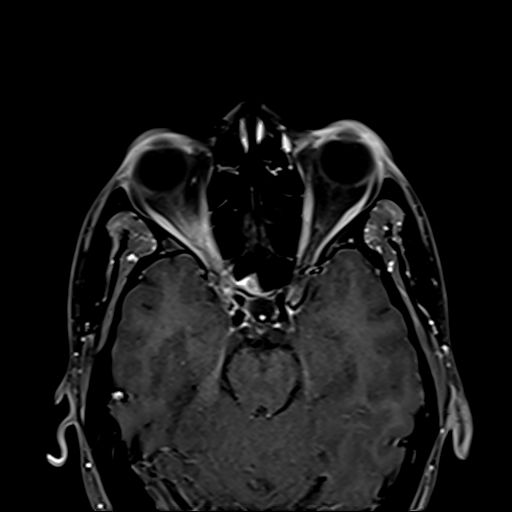
[im 18/18]
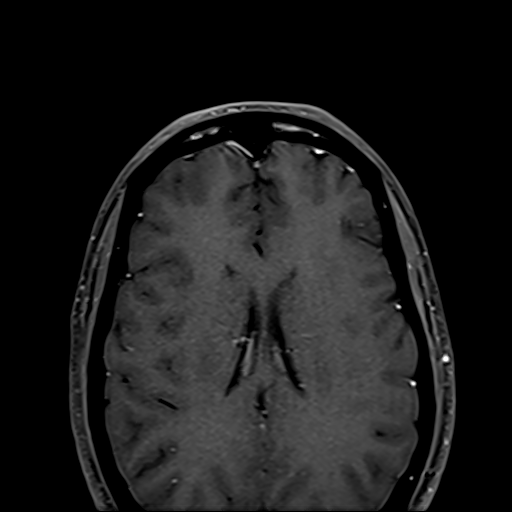

[20 of 48 positions shown; findings below may reference images not displayed]

FINDINGS: Postcontrast imaging was performed to further evaluate findings on
same day MRI due to poor contrast bolus timing on that study. There
is asymmetric enhancement of the prechiasmatic, intracanalicular and
intraorbital right optic nerve. The optic chiasm and left optic
nerves do not appear to enhance. There also appears to be mild
enhancement of the optic nerve sheath in these regions. No masslike
enhancement. As mentioned on same day MRI, the right optic nerve is
mildly enlarged diffusely relative to the left. Surrounding intra
orbital fat appears somewhat fuzzy on the right.

Question faint enhancement of a small right periventricular lesion
(series 7, image 9). Otherwise, no abnormal enhancement outside of
the orbits/optic nerves.
IMPRESSION: 1. Postcontrast imaging of the head and orbits was performed to
further evaluate finding seen on same day MRI. There is asymmetric
enhancement and mild enlargement of the prechiasmatic,
intracanalicular, and intraorbital right optic nerve, favored to
represent optic neuritis from demyelination given white matter
lesions suggestive of demyelination on the same day MRI. Idiopathic
perineuritis (pseudotumor) or infectious optic neuritis are thought
less likely.
2. Question faint enhancement of a small right periventricular
lesion (series 7, image 9), which may represent an area of active
demyelination. Otherwise, no abnormal enhancement outside of the
orbits/optic nerves.

## 2021-01-17 IMAGING — DX DG CHEST 1V PORT
1 series · 1 of 1 positions shown · non-contrast
Comparison: None.

CLINICAL DATA: Optic neuritis

EXAM:
PORTABLE CHEST 1 VIEW

[chest ap]
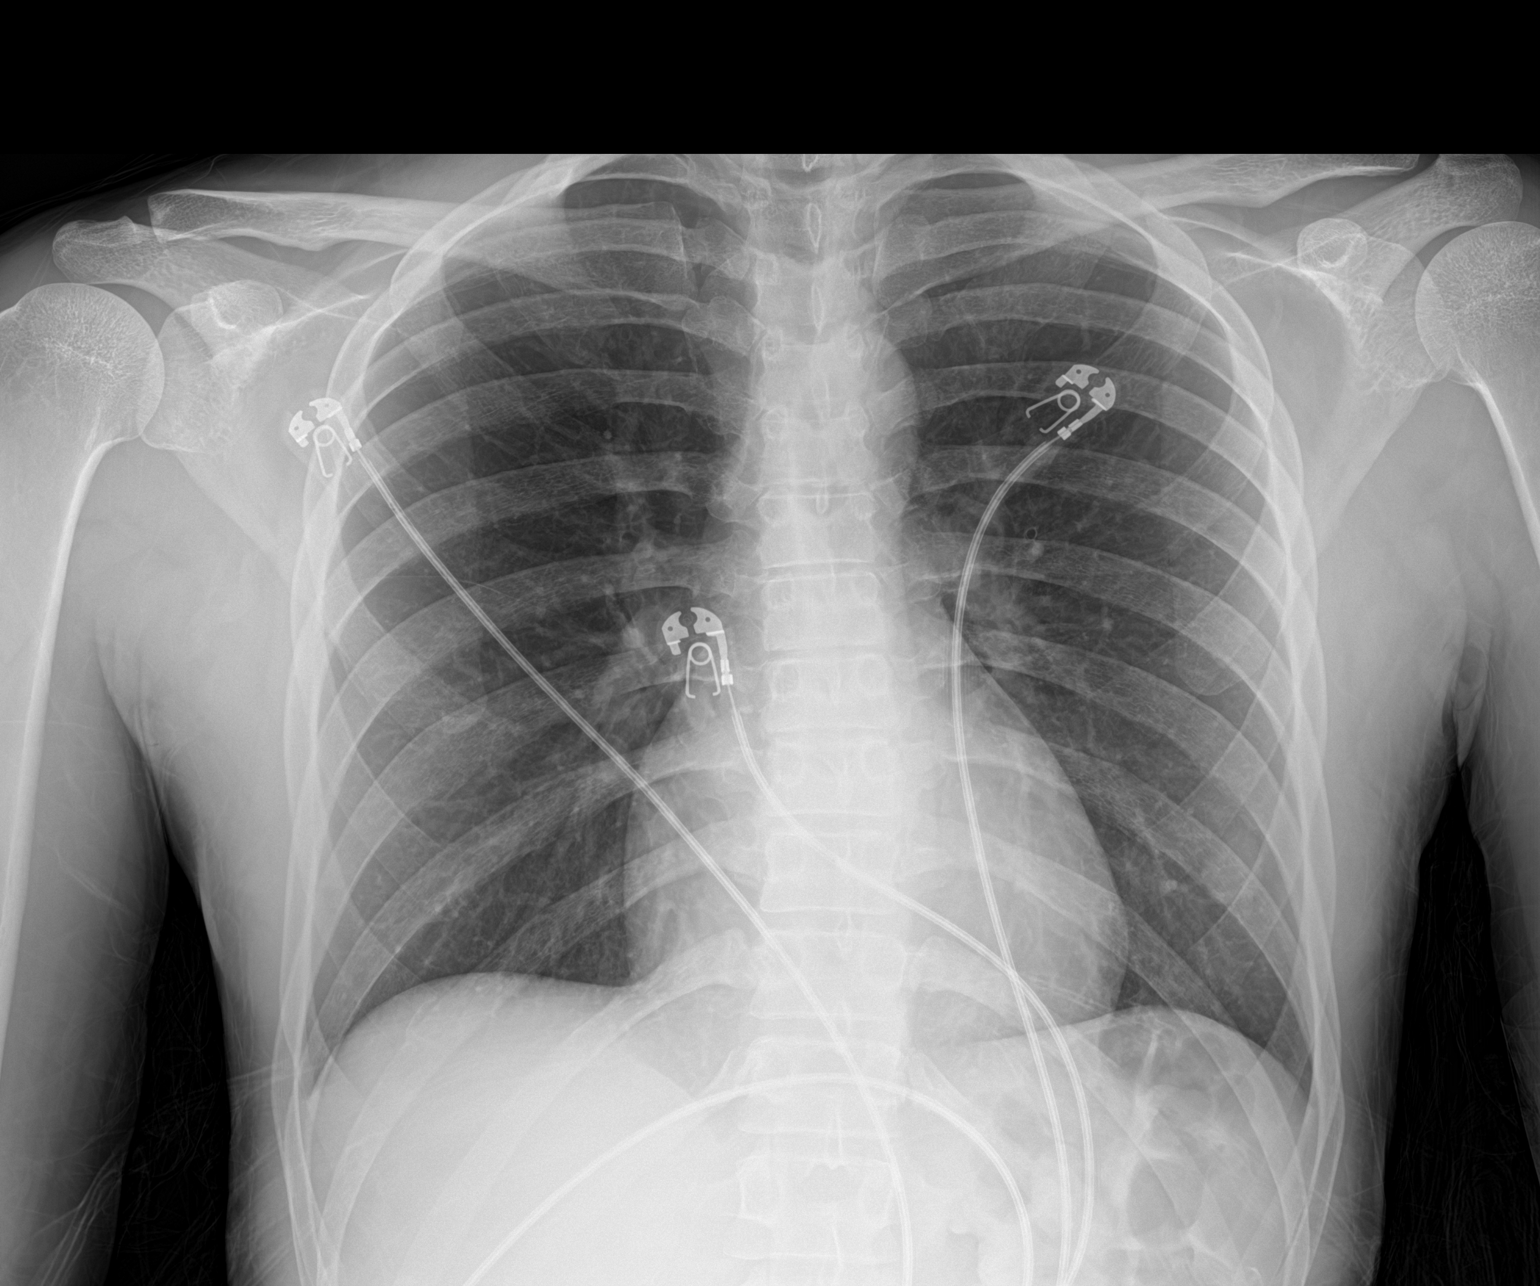

[1 of 1 positions shown; findings below may reference images not displayed]

FINDINGS: The heart size and mediastinal contours are within normal limits. No
evidence of hilar or mediastinal adenopathy. Both lungs are clear.
Right cervical rib. Artifact from EKG leads.
IMPRESSION: Negative chest.

## 2021-01-17 MED ORDER — ACETAMINOPHEN 650 MG RE SUPP: 650.0000 mg | Freq: Four times a day (QID) | RECTAL | Status: DC | PRN

## 2021-01-17 MED ORDER — ENOXAPARIN SODIUM 40 MG/0.4ML IJ SOSY
40.0000 mg | PREFILLED_SYRINGE | INTRAMUSCULAR | Status: DC
  Administered 2021-01-18: 40 mg via SUBCUTANEOUS
  Filled 2021-01-17 (×8): qty 0.4

## 2021-01-17 MED ORDER — SODIUM CHLORIDE 0.9 % IV SOLN
1000.0000 mg | Freq: Every day | INTRAVENOUS | Status: DC
  Administered 2021-01-17: 1000 mg via INTRAVENOUS
  Filled 2021-01-17 (×2): qty 8

## 2021-01-17 MED ORDER — GADOBUTROL 1 MMOL/ML IV SOLN
5.0000 mL | Freq: Once | INTRAVENOUS | Status: AC | PRN
  Administered 2021-01-17: 5 mL via INTRAVENOUS

## 2021-01-17 MED ORDER — VITAMIN B-12 1000 MCG PO TABS
500.0000 ug | ORAL_TABLET | Freq: Every day | ORAL | Status: DC
  Administered 2021-01-17 – 2021-01-28 (×12): 500 ug via ORAL
  Filled 2021-01-17 (×12): qty 1

## 2021-01-17 MED ORDER — ACETAMINOPHEN 325 MG PO TABS
650.0000 mg | ORAL_TABLET | Freq: Four times a day (QID) | ORAL | Status: DC | PRN
Start: 2021-01-17 — End: 2021-01-21
  Administered 2021-01-21: 650 mg via ORAL
  Filled 2021-01-17: qty 2

## 2021-01-17 MED ORDER — LORATADINE 10 MG PO TABS
10.0000 mg | ORAL_TABLET | Freq: Every day | ORAL | Status: DC | PRN
  Administered 2021-01-17: 10 mg via ORAL
  Filled 2021-01-17 (×2): qty 1

## 2021-01-17 MED ORDER — LORAZEPAM 0.5 MG PO TABS
0.5000 mg | ORAL_TABLET | Freq: Once | ORAL | Status: AC | PRN
  Administered 2021-01-17: 0.5 mg via ORAL
  Filled 2021-01-17: qty 1

## 2021-01-17 NOTE — ED Notes (Signed)
Placed Breakfast Order 

## 2021-01-17 NOTE — Consult Note (Signed)
Neurology Consultation  Reason for Consult: Right eye visual loss Referring Physician: Dr. Eudelia Bunch  CC: Right eye vision loss  History is obtained from: Patient  HPI: Danielle York is a 30 y.o. female no significant past medical history, with gradual onset of painful right eye visual loss presenting for evaluation. She reports that she was in her usual state of health up until 4 days ago when she started noticing that the vision to the right eye is looking somewhat blurry.  She thought she is developing a stye which she has had before which has caused some pain and blurred vision.  She had some pain in the eye around that time 2.  At this progressed to near complete vision loss in that eye.  She does not complain of much pain now but says that it was painful to start with.  She went to an optometrist who was concerned and sent her for further imaging and evaluation. She received an MRI of the brain-which is suboptimal after contrast administration but did show asymmetrical right optic nerve thickening concerning for acute optic neuritis in the setting of other T2/flair round oval looking hyperintensities concerning for demyelinating process. She has a family history of MS and maternal grand father and couple of distant cousins.  Has not had any prior episodes of vision loss.  No tingling numbness weakness.  No suggestion of symptoms getting worse with heat.  No headache.  No nausea vomiting.   ROS: Full ROS was performed and is negative except as noted in the HPI.   No past medical history on file.  No family history on file.   Social History:   has no history on file for tobacco use, alcohol use, and drug use.  Medications  Current Facility-Administered Medications:    methylPREDNISolone sodium succinate (SOLU-MEDROL) 1,000 mg in sodium chloride 0.9 % 50 mL IVPB, 1,000 mg, Intravenous, Daily, Cardama, Amadeo Garnet, MD   tetracaine (PONTOCAINE) 0.5 % ophthalmic solution 2 drop, 2 drop,  Both Eyes, Once, Caccavale, Sophia, PA-C No current outpatient medications on file.  Exam: Current vital signs: BP 112/76 (BP Location: Left Arm)   Pulse (!) 59   Temp 100 F (37.8 C) (Oral)   Resp 14   Ht 5\' 6"  (1.676 m)   Wt 57.2 kg   LMP 01/11/2021 (Approximate)   SpO2 95%   BMI 20.34 kg/m  Vital signs in last 24 hours: Temp:  [100 F (37.8 C)] 100 F (37.8 C) (07/31 2051) Pulse Rate:  [59-108] 59 (08/01 0223) Resp:  [14-20] 14 (08/01 0223) BP: (112-158)/(76-108) 112/76 (08/01 0223) SpO2:  [95 %-96 %] 95 % (08/01 0223) Weight:  [57.2 kg] 57.2 kg (07/31 2144) General: Awake alert in no distress, anxious but no distress HEENT: Normocephalic/atraumatic Lungs: Clear next cardiovascular: Regular rate rhythm Abdomen nondistended nontender Extremities warm well perfused Neurological exam Awake alert oriented x3 No dysarthria Aphasia Cranial nerve examination: Visual acuity in the right eye barely light perception, normal visual acuity of the left eye, pupils are equal round reactive to light-no evidence of APD, extraocular movements intact, visual fields full, facial sensation intact, face symmetric, tongue and palate midline. Motor exam with equal symmetric 5/5 strength in all 4 extremities. Sensation intact light touch all over without extinction Coordination exam with no dysmetria  Labs I have reviewed labs in epic and the results pertinent to this consultation are:  CBC    Component Value Date/Time   WBC 4.4 01/16/2021 2142   RBC 4.90  01/16/2021 2142   HGB 12.6 01/16/2021 2142   HCT 41.2 01/16/2021 2142   PLT 223 01/16/2021 2142   MCV 84.1 01/16/2021 2142   MCH 25.7 (L) 01/16/2021 2142   MCHC 30.6 01/16/2021 2142   RDW 13.2 01/16/2021 2142   LYMPHSABS 2.0 01/16/2021 2142   MONOABS 0.3 01/16/2021 2142   EOSABS 0.1 01/16/2021 2142   BASOSABS 0.0 01/16/2021 2142    CMP     Component Value Date/Time   NA 138 01/16/2021 2142   K 3.7 01/16/2021 2142   CL 104  01/16/2021 2142   CO2 26 01/16/2021 2142   GLUCOSE 97 01/16/2021 2142   BUN 8 01/16/2021 2142   CREATININE 1.10 (H) 01/16/2021 2142   CALCIUM 9.7 01/16/2021 2142   PROT 7.7 01/16/2021 2142   ALBUMIN 4.2 01/16/2021 2142   AST 28 01/16/2021 2142   ALT 21 01/16/2021 2142   ALKPHOS 30 (L) 01/16/2021 2142   BILITOT 1.1 01/16/2021 2142   GFRNONAA >60 01/16/2021 2142    Imaging I have reviewed the images obtained:  MRI of the brain with and without contrast and orbits with and without contrast: IMPRESSION: 1. Technically limited exam as while contrast was administered for this study, little to no contrast is seen onboard on postcontrast sequences, somewhat limiting assessment. 2. 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 No definite evidence for active demyelination on this limited study. 3. Subtle asymmetric enlargement and irregularity of the right optic nerve as above, highly suspicious for acute optic neuritis, particularly given the patient history and concomitant findings within the brain.  Assessment:  30 year old with painful vision loss of the right eye with brain and orbits MRI with and without contrast imaging although suboptimal contrast administration and postcontrast images-concerning for demyelinating process and acute right optic neuritis.  Definitely will need repeat MR brain with contrast and MR orbits with contrast-we will order.  Initial treatment will be with steroids along with the work-up below  Impression: -Possible right optic neuritis -Brain MRI suggestive of demyelinating process -Differentials include MS, versus neuromyelitis optica spectrum disorders  (NMOSD) versus MOG antibody disease -Need proper post contrast imaging to definitively ascertain and establish a diagnosis of MS  Recommendations: -IV Solu-Medrol 1 g x  3 days.  May need longer duration of Solu-Medrol versus plasma exchange.  This will depend on response to steroids -Check UA and chest x-ray-treat UTI if present. -Check neuromyelitis optica antibodies-ordered -Check vitamin B12, angiotensin-converting enzyme and Lyme serology-ordered -Check Anti-MOG antobody-send out test-ordered -Will need cervical spine imaging also to complete the work-up-no symptoms localizing to the cord, can be done outpatient. -Outpatient follow-up with Dr. Epimenio Foot at [facility] neurology for disease modifying treatment and follow-up of the above tests Discussed with the patient at bedside and Dr. Eudelia Bunch in the ER. Neurology will follow with you. -- Milon Dikes, MD Neurologist [facility] Pager: [phone]

## 2021-01-17 NOTE — H&P (Signed)
History and Physical    REXANNA LOUTHAN JEH:[id number] DOB: April 16, 1991 DOA: 01/16/2021  PCP: Pcp, No Patient coming from: Home  Chief Complaint: Right eye pain and vision loss  HPI: Danielle York is a 30 y.o. female with no significant past medical history presented to the ED with complaints of right thigh pain and vision loss which started a few days ago.  MRI of brain and orbits done but were suboptimal after contrast administration.  Showing patchy T2/FLAIR hyperintensities involving the supratentorial and infratentorial cerebral white matter most consistent with demyelinating disease/multiple sclerosis with no definite evidence of active demyelination.  Also showing subtle asymmetric enlargement and irregularity of the right optic nerve highly suspicious for acute optic neuritis. Neurology consulted.  Patient states 4 days ago she experienced some pain/discomfort in her right eye which she felt was due to her having a stye as she recently had a stye in her opposite eye.  She then started experiencing loss of vision in her right eye which is becoming progressively worse.  She had a headache 2 days ago which resolved after she took an over-the-counter medication.  She saw an optometrist yesterday who was concerned about optic neuritis and recommended further evaluation.  Denies personal history of MS but does report family history of MS on her mother side of the family (great grandfather and his sister, cousin).  She is endorsing slight dysuria and urinary frequency which she has experienced when she had UTIs in the past.  She is not vaccinated against COVID.  No other complaints.  Review of Systems:  All systems reviewed and apart from history of presenting illness, are negative.  History reviewed. No pertinent past medical history.  Past Surgical History:  Procedure Laterality Date   TONSILLECTOMY       reports that she has never smoked. She has never used smokeless tobacco. She reports  current alcohol use of about 4.0 standard drinks of alcohol per week. She reports that she does not use drugs.  Allergies  Allergen Reactions   Amoxicillin     Family History  Problem Relation Age of Onset   Multiple sclerosis Other     Prior to Admission medications   Not on File    Physical Exam: Vitals:   01/17/21 0400 01/17/21 0430 01/17/21 0500 01/17/21 0550  BP: (!) 124/92 111/62 108/68   Pulse: (!) 101 70 83   Resp: 15 12 14    Temp:    98.4 F (36.9 C)  TempSrc:    Oral  SpO2: 100% 98% 100%   Weight:      Height:        Physical Exam Constitutional:      General: She is not in acute distress. HENT:     Head: Normocephalic and atraumatic.  Eyes:     Extraocular Movements: Extraocular movements intact.     Conjunctiva/sclera: Conjunctivae normal.  Cardiovascular:     Rate and Rhythm: Normal rate and regular rhythm.     Pulses: Normal pulses.  Pulmonary:     Effort: Pulmonary effort is normal. No respiratory distress.     Breath sounds: No wheezing or rales.  Abdominal:     General: Bowel sounds are normal. There is no distension.     Palpations: Abdomen is soft.     Tenderness: There is no abdominal tenderness.  Musculoskeletal:        General: No swelling or tenderness.     Cervical back: Normal range of motion and neck supple.  Skin:    General: Skin is warm and dry.  Neurological:     Mental Status: She is alert and oriented to person, place, and time.     Labs on Admission: I have personally reviewed following labs and imaging studies  CBC: Recent Labs  Lab 01/16/21 2142  WBC 4.4  NEUTROABS 2.0  HGB 12.6  HCT 41.2  MCV 84.1  PLT 223   Basic Metabolic Panel: Recent Labs  Lab 01/16/21 2142  NA 138  K 3.7  CL 104  CO2 26  GLUCOSE 97  BUN 8  CREATININE 1.10*  CALCIUM 9.7   GFR: Estimated Creatinine Clearance: 69.4 mL/min (A) (by C-G formula based on SCr of 1.1 mg/dL (H)). Liver Function Tests: Recent Labs  Lab 01/16/21 2142   AST 28  ALT 21  ALKPHOS 30*  BILITOT 1.1  PROT 7.7  ALBUMIN 4.2   No results for input(s): LIPASE, AMYLASE in the last 168 hours. No results for input(s): AMMONIA in the last 168 hours. Coagulation Profile: No results for input(s): INR, PROTIME in the last 168 hours. Cardiac Enzymes: No results for input(s): CKTOTAL, CKMB, CKMBINDEX, TROPONINI in the last 168 hours. BNP (last 3 results) No results for input(s): PROBNP in the last 8760 hours. HbA1C: No results for input(s): HGBA1C in the last 72 hours. CBG: No results for input(s): GLUCAP in the last 168 hours. Lipid Profile: No results for input(s): CHOL, HDL, LDLCALC, TRIG, CHOLHDL, LDLDIRECT in the last 72 hours. Thyroid Function Tests: No results for input(s): TSH, T4TOTAL, FREET4, T3FREE, THYROIDAB in the last 72 hours. Anemia Panel: No results for input(s): VITAMINB12, FOLATE, FERRITIN, TIBC, IRON, RETICCTPCT in the last 72 hours. Urine analysis: No results found for: COLORURINE, APPEARANCEUR, LABSPEC, PHURINE, GLUCOSEU, HGBUR, BILIRUBINUR, KETONESUR, PROTEINUR, UROBILINOGEN, NITRITE, LEUKOCYTESUR  Radiological Exams on Admission: MR Brain W and Wo Contrast  Result Date: 01/17/2021 CLINICAL DATA:  Initial evaluation for acute right-sided visual disturbance, suspected optic neuritis. EXAM: MRI HEAD AND ORBITS WITHOUT AND WITH CONTRAST TECHNIQUE: Multiplanar, multiecho pulse sequences of the brain and surrounding structures were obtained without and with intravenous contrast. Multiplanar, multiecho pulse sequences of the orbits and surrounding structures were obtained including fat saturation techniques, before and after intravenous contrast administration. CONTRAST:  5mL GADAVIST GADOBUTROL 1 MMOL/ML IV SOLN COMPARISON:  None available. FINDINGS: MRI HEAD FINDINGS Brain: Examination is somewhat technically limited as while the patient did receive IV contrast material for this exam, little to no contrast material is seen within the  brain on postcontrast sequences, limiting assessment. Cerebral volume within normal limits. There are scattered patchy T2/FLAIR hyperintensities involving the periventricular and deep white matter of both cerebral hemispheres. A few probable scattered subtle juxtacortical lesions noted as well. Several of these foci radiate from the lateral ventricles in a perpendicular fashion. A few infratentorial lesions involving the left dorsal midbrain and right pons noted (series 12, images 9, 7). For reference purposes, the largest discrete lesion seen at the deep white matter of the right frontal centrum semi ovale and measures 1 cm (series 12, image 15). Multiple corresponding T1 black holes. Findings are most consistent with demyelinating disease/multiple sclerosis. Several of these lesions demonstrate associated T2 shine through, with no definite restricted diffusion to suggest active demyelination. Again, evaluation for associated enhancement is fairly limited on this exam as little to no contrast material is on board for postcontrast sequences. No evidence for acute or subacute infarct. Gray-white matter differentiation maintained. No encephalomalacia to suggest chronic cortical infarction. No  foci of susceptibility artifact to suggest acute or chronic intracranial hemorrhage. No mass lesion, midline shift or mass effect. No hydrocephalus or extra-axial fluid collection. Pituitary gland suprasellar region normal. Midline structures intact. Vascular: Major intracranial vascular flow voids are maintained. Skull and upper cervical spine: Craniocervical junction within normal limits. Bone marrow signal intensity normal. No scalp soft tissue abnormality. Other: No mastoid effusion.  Inner ear structures grossly normal. MRI ORBITS FINDINGS Orbits: Globes are symmetric in size with normal morphology bilaterally. While evaluation for possible optic neuritis is limited given the relative lack of IV contrast on this exam, there  is subtle asymmetric enlargement of the right optic nerve as compared to the left. Additionally, the nerve and/or nerve sheath itself appears somewhat fuzzy and irregular as compared to the contralateral left nerve (series 2, image 8). Findings also seen on corresponding coronal sequence (series 5, image 18). Given the patient history and corresponding findings in the brain, findings are highly suspicious for acute optic neuritis. No abnormality seen about the orbital apices. Optic chiasm normally situated within the suprasellar cistern without abnormality. No sellar or suprasellar mass. Extra-ocular muscles symmetric and within normal limits. Lacrimal glands normal. Superior orbital veins symmetric and within normal limits. No abnormality about the cavernous sinus. Visualized sinuses: Visualized paranasal sinuses are clear. Soft tissues: Unremarkable. IMPRESSION: 1. Technically limited exam as while contrast was administered for this study, little to no contrast is seen onboard on postcontrast sequences, somewhat limiting assessment. 2. 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 No definite evidence for active demyelination on this limited study. 3. Subtle asymmetric enlargement and irregularity of the right optic nerve as above, highly suspicious for acute optic neuritis, particularly given the patient history and concomitant findings within the brain. Electronically Signed   By: Rise Mu M.D.   On: 01/17/2021 03:28   DG Chest Port 1 View  Result Date: 01/17/2021 CLINICAL DATA:  Optic neuritis EXAM: PORTABLE CHEST 1 VIEW COMPARISON:  None. FINDINGS: The heart size and mediastinal contours are within normal limits. No evidence of hilar or mediastinal adenopathy. Both lungs are clear. Right cervical rib. Artifact from EKG leads. IMPRESSION: Negative chest. Electronically  Signed   By: Marnee Spring M.D.   On: 01/17/2021 04:33   MR ORBITS W WO CONTRAST  Result Date: 01/17/2021 CLINICAL DATA:  Initial evaluation for acute right-sided visual disturbance, suspected optic neuritis. EXAM: MRI HEAD AND ORBITS WITHOUT AND WITH CONTRAST TECHNIQUE: Multiplanar, multiecho pulse sequences of the brain and surrounding structures were obtained without and with intravenous contrast. Multiplanar, multiecho pulse sequences of the orbits and surrounding structures were obtained including fat saturation techniques, before and after intravenous contrast administration. CONTRAST:  77mL GADAVIST GADOBUTROL 1 MMOL/ML IV SOLN COMPARISON:  None available. FINDINGS: MRI HEAD FINDINGS Brain: Examination is somewhat technically limited as while the patient did receive IV contrast material for this exam, little to no contrast material is seen within the brain on postcontrast sequences, limiting assessment. Cerebral volume within normal limits. There are scattered patchy T2/FLAIR hyperintensities involving the periventricular and deep white matter of both cerebral hemispheres. A few probable scattered subtle juxtacortical lesions noted as well. Several of these foci radiate from the lateral ventricles in a perpendicular fashion. A few infratentorial lesions involving the left dorsal midbrain and right pons noted (series 12, images 9, 7). For reference purposes, the largest discrete lesion seen at the deep white matter of the right frontal  centrum semi ovale and measures 1 cm (series 12, image 15). Multiple corresponding T1 black holes. Findings are most consistent with demyelinating disease/multiple sclerosis. Several of these lesions demonstrate associated T2 shine through, with no definite restricted diffusion to suggest active demyelination. Again, evaluation for associated enhancement is fairly limited on this exam as little to no contrast material is on board for postcontrast sequences. No evidence for  acute or subacute infarct. Gray-white matter differentiation maintained. No encephalomalacia to suggest chronic cortical infarction. No foci of susceptibility artifact to suggest acute or chronic intracranial hemorrhage. No mass lesion, midline shift or mass effect. No hydrocephalus or extra-axial fluid collection. Pituitary gland suprasellar region normal. Midline structures intact. Vascular: Major intracranial vascular flow voids are maintained. Skull and upper cervical spine: Craniocervical junction within normal limits. Bone marrow signal intensity normal. No scalp soft tissue abnormality. Other: No mastoid effusion.  Inner ear structures grossly normal. MRI ORBITS FINDINGS Orbits: Globes are symmetric in size with normal morphology bilaterally. While evaluation for possible optic neuritis is limited given the relative lack of IV contrast on this exam, there is subtle asymmetric enlargement of the right optic nerve as compared to the left. Additionally, the nerve and/or nerve sheath itself appears somewhat fuzzy and irregular as compared to the contralateral left nerve (series 2, image 8). Findings also seen on corresponding coronal sequence (series 5, image 18). Given the patient history and corresponding findings in the brain, findings are highly suspicious for acute optic neuritis. No abnormality seen about the orbital apices. Optic chiasm normally situated within the suprasellar cistern without abnormality. No sellar or suprasellar mass. Extra-ocular muscles symmetric and within normal limits. Lacrimal glands normal. Superior orbital veins symmetric and within normal limits. No abnormality about the cavernous sinus. Visualized sinuses: Visualized paranasal sinuses are clear. Soft tissues: Unremarkable. IMPRESSION: 1. Technically limited exam as while contrast was administered for this study, little to no contrast is seen onboard on postcontrast sequences, somewhat limiting assessment. 2. Patchy T2/FLAIR  hyperintensities involving the supratentorial and infratentorial cerebral white matter as above, most consistent with demyelinating disease/multiple sclerosis. No definite evidence for active demyelination on this limited study. 3. Subtle asymmetric enlargement and irregularity of the right optic nerve as above, highly suspicious for acute optic neuritis, particularly given the patient history and concomitant findings within the brain. Electronically Signed   By: Rise Mu M.D.   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  MRI of brain and orbits done but were suboptimal after contrast administration.  Showing patchy T2/FLAIR hyperintensities involving the supratentorial and infratentorial cerebral white matter most consistent with demyelinating disease/multiple sclerosis with no definite evidence of active demyelination.  Also showing subtle asymmetric enlargement and irregularity of the right optic nerve highly suspicious for acute optic neuritis. Patient was seen by neurology - differentials include MS, neuromyelitis optica syndrome disorders, and MOG antibody disease.  -Neurology recommending: IV Solu-Medrol 1 g x 3 days.  May need longer duration of Solu-Medrol versus plasma exchange depending on response to steroids.  Labs ordered including neuromyelitis optica antibodies, vitamin B12, ACE, Lyme serology, and anti-MOG antibody.  Repeat brain MRI with contrast and MRI orbits with contrast ordered.  Recommending obtaining C-spine imaging on an outpatient basis.  Patient will need outpatient follow-up with Dr. Mariane Baumgarten at [facility] neurology for disease  modifying treatment and follow-up of the above tests.  Neurology team will continue to follow in the hospital.  Dysuria, urinary frequency No fever  or leukocytosis. -UA pending to rule out UTI.    HIV screening The patient falls between the ages of 13-64 and should be screened for HIV, therefore HIV testing ordered.  DVT prophylaxis: Lovenox Code Status: Full code Family Communication: No family available at this time. Disposition Plan: Status is: Inpatient  Remains inpatient appropriate because:Ongoing diagnostic testing needed not appropriate for outpatient work up and Inpatient level of care appropriate due to severity of illness  Dispo: The patient is from: Home              Anticipated d/c is to: Home              Patient currently is not medically stable to d/c.   Difficult to place patient No  Level of care: Level of care: Med-Surg  The medical decision making on this patient was of high complexity and the patient is at high risk for clinical deterioration, therefore this is a level 3 visit.  John Giovanni MD [facility]  If 7PM-7AM, please contact night-coverage [email or web address]  01/17/2021, 6:04 AM

## 2021-01-17 NOTE — Plan of Care (Signed)
  Problem: Education: Goal: Knowledge of General Education information will improve Description Including pain rating scale, medication(s)/side effects and non-pharmacologic comfort measures Outcome: Progressing   

## 2021-01-17 NOTE — Progress Notes (Signed)
PROGRESS NOTE                                                                                                                                                                                                             Patient Demographics:    Danielle York, is a 30 y.o. female, DOB - 1991/05/11, ZOX:[id number]  Outpatient Primary MD for the patient is Pcp, No    LOS - 0  Admit date - 01/16/2021    Chief Complaint  Patient presents with   Eye Problem       Brief Narrative (HPI from H&P)  Danielle York is a 30 y.o. 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   Subjective:    Danielle ClinchBria York today has, No headache, No chest pain, No abdominal pain - No Nausea, No new weakness tingling or numbness, no SOB, mild improvement in right eye vision loss.   Assessment  & Plan :   Acute right eye pain along with right eye vision loss - MRI suggestive of right eye optic neuritis along with some questionable demyelinating process in the brain as well, neurology following the patient currently on 3 days of IV steroid regimen, vision has slightly improved on 01/17/2021 we will continue to monitor.       Condition - Fair  Family Communication  : Mother Danielle DavenportSandra [phone] on 01/17/2021  Code Status :  Full  Consults  :  Neuro  PUD Prophylaxis : None   Procedures  :            Disposition Plan  :    Status is: Inpatient  Remains inpatient appropriate because:IV treatments appropriate due to intensity of illness or inability to take PO  Dispo: The patient is from: Home              Anticipated d/c is to: Home              Patient currently is not medically stable to d/c.   Difficult to place patient Yes   DVT  Prophylaxis  :    enoxaparin (LOVENOX) injection 40 mg Start: 01/17/21 1000    Lab  Results  Component Value Date   PLT 223 01/16/2021    Diet :  Diet Order             Diet Heart Room service appropriate? Yes; Fluid consistency: Thin  Diet effective now                    Inpatient Medications  Scheduled Meds:  enoxaparin (LOVENOX) injection  40 mg Subcutaneous Q24H   vitamin B-12  500 mcg Oral Daily   Continuous Infusions:  methylPREDNISolone (SOLU-MEDROL) injection Stopped (01/17/21 0625)   PRN Meds:.acetaminophen **OR** acetaminophen  Antibiotics  :    Anti-infectives (From admission, onward)    None        Time Spent in minutes  30   Susa Raring M.D on 01/17/2021 at 11:13 AM  To page go to [email or web address]   [facility] -  Office  [phone]    See all Orders from today for further details    Objective:   Vitals:   01/17/21 0830 01/17/21 1000 01/17/21 1030 01/17/21 1100  BP: (!) 103/56 117/73 106/71 120/85  Pulse: 62 61 (!) 58 79  Resp: (!) 21 19 19 20   Temp:      TempSrc:      SpO2: 98% 98% 98% 99%  Weight:      Height:        Wt Readings from Last 3 Encounters:  01/16/21 57.2 kg    No intake or output data in the 24 hours ending 01/17/21 1113   Physical Exam  Awake Alert, No new F.N deficits, Normal affect, R Nasal hemianopia [place].AT,PERRAL Supple Neck,No JVD, No cervical lymphadenopathy appriciated.  Symmetrical Chest wall movement, Good air movement bilaterally, CTAB RRR,No Gallops,Rubs or new Murmurs, No Parasternal Heave +ve B.Sounds, Abd Soft, No tenderness, No organomegaly appriciated, No rebound - guarding or rigidity. No Cyanosis, Clubbing or edema,     Data Review:    CBC Recent Labs  Lab 01/16/21 2142  WBC 4.4  HGB 12.6  HCT 41.2  PLT 223  MCV 84.1  MCH 25.7*  MCHC 30.6  RDW 13.2  LYMPHSABS 2.0  MONOABS 0.3  EOSABS 0.1  BASOSABS 0.0    Recent Labs  Lab 01/16/21 2142  NA 138  K 3.7  CL  104  CO2 26  GLUCOSE 97  BUN 8  CREATININE 1.10*  CALCIUM 9.7  AST 28  ALT 21  ALKPHOS 30*  BILITOT 1.1  ALBUMIN 4.2    ------------------------------------------------------------------------------------------------------------------ No results for input(s): CHOL, HDL, LDLCALC, TRIG, CHOLHDL, LDLDIRECT in the last 72 hours.  No results found for: HGBA1C ------------------------------------------------------------------------------------------------------------------ No results for input(s): TSH, T4TOTAL, T3FREE, THYROIDAB in the last 72 hours.  Invalid input(s): FREET3  Cardiac Enzymes No results for input(s): CKMB, TROPONINI, MYOGLOBIN in the last 168 hours.  Invalid input(s): CK ------------------------------------------------------------------------------------------------------------------ No results found for: BNP  Micro Results No results found for this or any previous visit (from the past 240 hour(s)).  Radiology Reports MR BRAIN W CONTRAST  Result Date: 01/17/2021 CLINICAL DATA:  Vision loss, monocular. EXAM: MRI HEAD AND ORBITS WITH CONTRAST TECHNIQUE: Multiplanar, multiecho pulse sequences of the brain and surrounding structures were obtained with intravenous contrast. Multiplanar, multiecho pulse sequences of the orbits and surrounding structures were obtained including fat saturation techniques, after intravenous contrast administration. CONTRAST:  5mL GADAVIST GADOBUTROL 1 MMOL/ML IV SOLN COMPARISON:  Same day MRI head/orbits. FINDINGS: Postcontrast imaging was performed to further evaluate findings on same day MRI due to  poor contrast bolus timing on that study. There is asymmetric enhancement of the prechiasmatic, intracanalicular and intraorbital right optic nerve. The optic chiasm and left optic nerves do not appear to enhance. There also appears to be mild enhancement of the optic nerve sheath in these regions. No masslike enhancement. As mentioned on same day  MRI, the right optic nerve is mildly enlarged diffusely relative to the left. Surrounding intra orbital fat appears somewhat fuzzy on the right. Question faint enhancement of a small right periventricular lesion (series 7, image 9). Otherwise, no abnormal enhancement outside of the orbits/optic nerves. IMPRESSION: 1. Postcontrast imaging of the head and orbits was performed to further evaluate finding seen on same day MRI. There is asymmetric enhancement and mild enlargement of the prechiasmatic, intracanalicular, and intraorbital right optic nerve, favored to represent optic neuritis from demyelination given white matter lesions suggestive of demyelination on the same day MRI. Idiopathic perineuritis (pseudotumor) or infectious optic neuritis are thought less likely. 2. Question faint enhancement of a small right periventricular lesion (series 7, image 9), which may represent an area of active demyelination. Otherwise, no abnormal enhancement outside of the orbits/optic nerves. Electronically Signed   By: Feliberto Harts MD   On: 01/17/2021 07:43   MR Brain W and Wo Contrast  Result Date: 01/17/2021 CLINICAL DATA:  Initial evaluation for acute right-sided visual disturbance, suspected optic neuritis. EXAM: MRI HEAD AND ORBITS WITHOUT AND WITH CONTRAST TECHNIQUE: Multiplanar, multiecho pulse sequences of the brain and surrounding structures were obtained without and with intravenous contrast. Multiplanar, multiecho pulse sequences of the orbits and surrounding structures were obtained including fat saturation techniques, before and after intravenous contrast administration. CONTRAST:  73mL GADAVIST GADOBUTROL 1 MMOL/ML IV SOLN COMPARISON:  None available. FINDINGS: MRI HEAD FINDINGS Brain: Examination is somewhat technically limited as while the patient did receive IV contrast material for this exam, little to no contrast material is seen within the brain on postcontrast sequences, limiting assessment. Cerebral  volume within normal limits. There are scattered patchy T2/FLAIR hyperintensities involving the periventricular and deep white matter of both cerebral hemispheres. A few probable scattered subtle juxtacortical lesions noted as well. Several of these foci radiate from the lateral ventricles in a perpendicular fashion. A few infratentorial lesions involving the left dorsal midbrain and right pons noted (series 12, images 9, 7). For reference purposes, the largest discrete lesion seen at the deep white matter of the right frontal centrum semi ovale and measures 1 cm (series 12, image 15). Multiple corresponding T1 black holes. Findings are most consistent with demyelinating disease/multiple sclerosis. Several of these lesions demonstrate associated T2 shine through, with no definite restricted diffusion to suggest active demyelination. Again, evaluation for associated enhancement is fairly limited on this exam as little to no contrast material is on board for postcontrast sequences. No evidence for acute or subacute infarct. Gray-white matter differentiation maintained. No encephalomalacia to suggest chronic cortical infarction. No foci of susceptibility artifact to suggest acute or chronic intracranial hemorrhage. No mass lesion, midline shift or mass effect. No hydrocephalus or extra-axial fluid collection. Pituitary gland suprasellar region normal. Midline structures intact. Vascular: Major intracranial vascular flow voids are maintained. Skull and upper cervical spine: Craniocervical junction within normal limits. Bone marrow signal intensity normal. No scalp soft tissue abnormality. Other: No mastoid effusion.  Inner ear structures grossly normal. MRI ORBITS FINDINGS Orbits: Globes are symmetric in size with normal morphology bilaterally. While evaluation for possible optic neuritis is limited given the relative lack of IV contrast  on this exam, there is subtle asymmetric enlargement of the right optic nerve as  compared to the left. Additionally, the nerve and/or nerve sheath itself appears somewhat fuzzy and irregular as compared to the contralateral left nerve (series 2, image 8). Findings also seen on corresponding coronal sequence (series 5, image 18). Given the patient history and corresponding findings in the brain, findings are highly suspicious for acute optic neuritis. No abnormality seen about the orbital apices. Optic chiasm normally situated within the suprasellar cistern without abnormality. No sellar or suprasellar mass. Extra-ocular muscles symmetric and within normal limits. Lacrimal glands normal. Superior orbital veins symmetric and within normal limits. No abnormality about the cavernous sinus. Visualized sinuses: Visualized paranasal sinuses are clear. Soft tissues: Unremarkable. IMPRESSION: 1. Technically limited exam as while contrast was administered for this study, little to no contrast is seen onboard on postcontrast sequences, somewhat limiting assessment. 2. 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 No definite evidence for active demyelination on this limited study. 3. Subtle asymmetric enlargement and irregularity of the right optic nerve as above, highly suspicious for acute optic neuritis, particularly given the patient history and concomitant findings within the brain. Electronically Signed   By: Rise Mu M.D.   On: 01/17/2021 03:28   DG Chest Port 1 View  Result Date: 01/17/2021 CLINICAL DATA:  Optic neuritis EXAM: PORTABLE CHEST 1 VIEW COMPARISON:  None. FINDINGS: The heart size and mediastinal contours are within normal limits. No evidence of hilar or mediastinal adenopathy. Both lungs are clear. Right cervical rib. Artifact from EKG leads. IMPRESSION: Negative chest. Electronically Signed   By: Marnee Spring M.D.   On: 01/17/2021 04:33    MR ORBITS W CONTRAST  Result Date: 01/17/2021 CLINICAL DATA:  Vision loss, monocular. EXAM: MRI HEAD AND ORBITS WITH CONTRAST TECHNIQUE: Multiplanar, multiecho pulse sequences of the brain and surrounding structures were obtained with intravenous contrast. Multiplanar, multiecho pulse sequences of the orbits and surrounding structures were obtained including fat saturation techniques, after intravenous contrast administration. CONTRAST:  49mL GADAVIST GADOBUTROL 1 MMOL/ML IV SOLN COMPARISON:  Same day MRI head/orbits. FINDINGS: Postcontrast imaging was performed to further evaluate findings on same day MRI due to poor contrast bolus timing on that study. There is asymmetric enhancement of the prechiasmatic, intracanalicular and intraorbital right optic nerve. The optic chiasm and left optic nerves do not appear to enhance. There also appears to be mild enhancement of the optic nerve sheath in these regions. No masslike enhancement. As mentioned on same day MRI, the right optic nerve is mildly enlarged diffusely relative to the left. Surrounding intra orbital fat appears somewhat fuzzy on the right. Question faint enhancement of a small right periventricular lesion (series 7, image 9). Otherwise, no abnormal enhancement outside of the orbits/optic nerves. IMPRESSION: 1. Postcontrast imaging of the head and orbits was performed to further evaluate finding seen on same day MRI. There is asymmetric enhancement and mild enlargement of the prechiasmatic, intracanalicular, and intraorbital right optic nerve, favored to represent optic neuritis from demyelination given white matter lesions suggestive of demyelination on the same day MRI. Idiopathic perineuritis (pseudotumor) or infectious optic neuritis are thought less likely. 2. Question faint enhancement of a small right periventricular lesion (series 7, image 9), which may represent an area of active demyelination. Otherwise, no abnormal enhancement outside of the  orbits/optic nerves. Electronically Signed   By: Feliberto Harts MD   On: 01/17/2021 07:43  MR ORBITS W WO CONTRAST  Result Date: 01/17/2021 CLINICAL DATA:  Initial evaluation for acute right-sided visual disturbance, suspected optic neuritis. EXAM: MRI HEAD AND ORBITS WITHOUT AND WITH CONTRAST TECHNIQUE: Multiplanar, multiecho pulse sequences of the brain and surrounding structures were obtained without and with intravenous contrast. Multiplanar, multiecho pulse sequences of the orbits and surrounding structures were obtained including fat saturation techniques, before and after intravenous contrast administration. CONTRAST:  76mL GADAVIST GADOBUTROL 1 MMOL/ML IV SOLN COMPARISON:  None available. FINDINGS: MRI HEAD FINDINGS Brain: Examination is somewhat technically limited as while the patient did receive IV contrast material for this exam, little to no contrast material is seen within the brain on postcontrast sequences, limiting assessment. Cerebral volume within normal limits. There are scattered patchy T2/FLAIR hyperintensities involving the periventricular and deep white matter of both cerebral hemispheres. A few probable scattered subtle juxtacortical lesions noted as well. Several of these foci radiate from the lateral ventricles in a perpendicular fashion. A few infratentorial lesions involving the left dorsal midbrain and right pons noted (series 12, images 9, 7). For reference purposes, the largest discrete lesion seen at the deep white matter of the right frontal centrum semi ovale and measures 1 cm (series 12, image 15). Multiple corresponding T1 black holes. Findings are most consistent with demyelinating disease/multiple sclerosis. Several of these lesions demonstrate associated T2 shine through, with no definite restricted diffusion to suggest active demyelination. Again, evaluation for associated enhancement is fairly limited on this exam as little to no contrast material is on board for  postcontrast sequences. No evidence for acute or subacute infarct. Gray-white matter differentiation maintained. No encephalomalacia to suggest chronic cortical infarction. No foci of susceptibility artifact to suggest acute or chronic intracranial hemorrhage. No mass lesion, midline shift or mass effect. No hydrocephalus or extra-axial fluid collection. Pituitary gland suprasellar region normal. Midline structures intact. Vascular: Major intracranial vascular flow voids are maintained. Skull and upper cervical spine: Craniocervical junction within normal limits. Bone marrow signal intensity normal. No scalp soft tissue abnormality. Other: No mastoid effusion.  Inner ear structures grossly normal. MRI ORBITS FINDINGS Orbits: Globes are symmetric in size with normal morphology bilaterally. While evaluation for possible optic neuritis is limited given the relative lack of IV contrast on this exam, there is subtle asymmetric enlargement of the right optic nerve as compared to the left. Additionally, the nerve and/or nerve sheath itself appears somewhat fuzzy and irregular as compared to the contralateral left nerve (series 2, image 8). Findings also seen on corresponding coronal sequence (series 5, image 18). Given the patient history and corresponding findings in the brain, findings are highly suspicious for acute optic neuritis. No abnormality seen about the orbital apices. Optic chiasm normally situated within the suprasellar cistern without abnormality. No sellar or suprasellar mass. Extra-ocular muscles symmetric and within normal limits. Lacrimal glands normal. Superior orbital veins symmetric and within normal limits. No abnormality about the cavernous sinus. Visualized sinuses: Visualized paranasal sinuses are clear. Soft tissues: Unremarkable. IMPRESSION: 1. Technically limited exam as while contrast was administered for this study, little to no contrast is seen onboard on postcontrast sequences, somewhat  limiting assessment. 2. 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 No definite evidence for active demyelination on this limited study. 3. Subtle asymmetric enlargement and irregularity of the right optic nerve as above, highly suspicious for acute optic neuritis, particularly given the patient history and concomitant findings within the brain. Electronically Signed  By: Rise Mu M.D.   On: 01/17/2021 03:28

## 2021-01-17 NOTE — ED Notes (Signed)
Patient back from MRI.

## 2021-01-17 NOTE — Progress Notes (Signed)
Pt states that she feels anxious and would like medication to help. Also states that she takes Zyrtec at home daily for allergies and would also like to have that medication. Provider paged for new medication orders.

## 2021-01-17 NOTE — ED Notes (Signed)
UA marked complete in error, pt unable to provide sample at this time.

## 2021-01-17 NOTE — ED Notes (Signed)
Patient transported to MRI 

## 2021-01-17 NOTE — ED Notes (Signed)
Tele tracking has been put in for this pt. 

## 2021-01-17 NOTE — ED Provider Notes (Signed)
[facility] EMERGENCY DEPARTMENT Provider Note  CSN: [id number] Arrival date & time: 01/16/21 2045  Chief Complaint(s) Eye Problem  HPI Danielle York is a 30 y.o. female with no pertinent past medical history who was referred to the emergency department by her optometrist for work-up of optic neuritis.  She reports she has had 2 days of right eye central vision loss.  During the evaluation by the optometrist he saw signs concerning for the above.  Symptoms have been persistent since onset.  She denies any other focal deficits.  No recent fevers or infections.  No coughing or congestion.  No chest pain or shortness of breath.  No other physical complaints.   Eye Problem  Past Medical History History reviewed. No pertinent past medical history. Patient Active Problem List   Diagnosis Date Noted   Optic neuritis 01/17/2021   Dysuria 01/17/2021   Home Medication(s) Prior to Admission medications   Not on File                                                                                                                                    Past Surgical History ** The histories are not reviewed yet. Please review them in the "History" navigator section and refresh this SmartLink. Family History Family History  Problem Relation Age of Onset   Multiple sclerosis Other     Social History Social History   Tobacco Use   Smoking status: Never   Smokeless tobacco: Never  Vaping Use   Vaping Use: Never used  Substance Use Topics   Alcohol use: Yes    Alcohol/week: 4.0 standard drinks    Types: 4 Standard drinks or equivalent per week   Drug use: Never   Allergies Amoxicillin  Review of Systems Review of Systems All other systems are reviewed and are negative for acute change except as noted in the HPI  Physical Exam Vital Signs  I have reviewed the triage vital signs BP 112/76 (BP Location: Left Arm)   Pulse (!) 59   Temp 100 F (37.8 C) (Oral)   Resp  14   Ht 5\' 6"  (1.676 m)   Wt 57.2 kg   LMP 01/11/2021 (Approximate)   SpO2 95%   BMI 20.34 kg/m   Physical Exam Vitals reviewed.  Constitutional:      General: She is not in acute distress.    Appearance: She is well-developed. She is not diaphoretic.  HENT:     Head: Normocephalic and atraumatic.     Nose: Nose normal.  Eyes:     General: No scleral icterus.       Right eye: No discharge.        Left eye: No discharge.     Conjunctiva/sclera: Conjunctivae normal.     Pupils: Pupils are equal, round, and reactive to light.  Cardiovascular:     Rate and Rhythm: Normal rate and regular rhythm.  Heart sounds: No murmur heard.   No friction rub. No gallop.  Pulmonary:     Effort: Pulmonary effort is normal. No respiratory distress.     Breath sounds: Normal breath sounds. No stridor. No rales.  Abdominal:     General: There is no distension.     Palpations: Abdomen is soft.     Tenderness: There is no abdominal tenderness.  Musculoskeletal:        General: No tenderness.     Cervical back: Normal range of motion and neck supple.  Skin:    General: Skin is warm and dry.     Findings: No erythema or rash.  Neurological:     Mental Status: She is alert and oriented to person, place, and time.     Comments: Mental Status:  Alert and oriented to person, place, and time.  Attention and concentration normal.  Speech clear.  Recent memory is intact  Cranial Nerves:  II Visual Fields: Intact to confrontation. Visual fields intact. Red desaturation test abnormal on right III, IV, VI: Pupils equal and reactive to light and near. Full eye movement without nystagmus  V Facial Sensation: Normal. No weakness of masticatory muscles  VII: No facial weakness or asymmetry  VIII Auditory Acuity: Grossly normal  IX/X: The uvula is midline; the palate elevates symmetrically  XI: Normal sternocleidomastoid and trapezius strength  XII: The tongue is midline. No atrophy or  fasciculations.   Motor System: Muscle Strength: 5/5 and symmetric in the upper and lower extremities. No pronation or drift.  Muscle Tone: Tone and muscle bulk are normal in the upper and lower extremities.  Coordination: Intact finger-to-nose.No tremor.  Sensation: Intact to light touch. Gait: Routine gait normal.     ED Results and Treatments Labs (all labs ordered are listed, but only abnormal results are displayed) Labs Reviewed  CBC WITH DIFFERENTIAL/PLATELET - Abnormal; Notable for the following components:      Result Value   MCH 25.7 (*)    All other components within normal limits  COMPREHENSIVE METABOLIC PANEL - Abnormal; Notable for the following components:   Creatinine, Ser 1.10 (*)    Alkaline Phosphatase 30 (*)    All other components within normal limits  SARS CORONAVIRUS 2 (TAT 6-24 HRS)  VITAMIN B12  URINALYSIS, ROUTINE W REFLEX MICROSCOPIC  NEUROMYELITIS OPTICA AUTOAB, IGG  LYME DISEASE DNA BY PCR([facility])  ANGIOTENSIN CONVERTING ENZYME  MISC [facility] TEST (SEND OUT)  HIV ANTIBODY (ROUTINE TESTING W REFLEX)  I-STAT BETA HCG BLOOD, ED ([facility], [facility], [facility] ONLY)                                                                                                                         EKG  EKG Interpretation  Date/Time:    Ventricular Rate:    PR Interval:    QRS Duration:   QT Interval:    QTC Calculation:   R Axis:     Text Interpretation:  Radiology MR BRAIN W CONTRAST  Result Date: 01/17/2021 CLINICAL DATA:  Vision loss, monocular. EXAM: MRI HEAD AND ORBITS WITH CONTRAST TECHNIQUE: Multiplanar, multiecho pulse sequences of the brain and surrounding structures were obtained with intravenous contrast. Multiplanar, multiecho pulse sequences of the orbits and surrounding structures were obtained including fat saturation techniques, after intravenous contrast administration. CONTRAST:  33mL GADAVIST GADOBUTROL 1 MMOL/ML IV SOLN COMPARISON:  Same day MRI  head/orbits. FINDINGS: Postcontrast imaging was performed to further evaluate findings on same day MRI due to poor contrast bolus timing on that study. There is asymmetric enhancement of the prechiasmatic, intracanalicular and intraorbital right optic nerve. The optic chiasm and left optic nerves do not appear to enhance. There also appears to be mild enhancement of the optic nerve sheath in these regions. No masslike enhancement. As mentioned on same day MRI, the right optic nerve is mildly enlarged diffusely relative to the left. Surrounding intra orbital fat appears somewhat fuzzy on the right. Question faint enhancement of a small right periventricular lesion (series 7, image 9). Otherwise, no abnormal enhancement outside of the orbits/optic nerves. IMPRESSION: 1. Postcontrast imaging of the head and orbits was performed to further evaluate finding seen on same day MRI. There is asymmetric enhancement and mild enlargement of the prechiasmatic, intracanalicular, and intraorbital right optic nerve, favored to represent optic neuritis from demyelination given white matter lesions suggestive of demyelination on the same day MRI. Idiopathic perineuritis (pseudotumor) or infectious optic neuritis are thought less likely. 2. Question faint enhancement of a small right periventricular lesion (series 7, image 9), which may represent an area of active demyelination. Otherwise, no abnormal enhancement outside of the orbits/optic nerves. Electronically Signed   By: Feliberto Harts MD   On: 01/17/2021 07:43   MR Brain W and Wo Contrast  Result Date: 01/17/2021 CLINICAL DATA:  Initial evaluation for acute right-sided visual disturbance, suspected optic neuritis. EXAM: MRI HEAD AND ORBITS WITHOUT AND WITH CONTRAST TECHNIQUE: Multiplanar, multiecho pulse sequences of the brain and surrounding structures were obtained without and with intravenous contrast. Multiplanar, multiecho pulse sequences of the orbits and surrounding  structures were obtained including fat saturation techniques, before and after intravenous contrast administration. CONTRAST:  78mL GADAVIST GADOBUTROL 1 MMOL/ML IV SOLN COMPARISON:  None available. FINDINGS: MRI HEAD FINDINGS Brain: Examination is somewhat technically limited as while the patient did receive IV contrast material for this exam, little to no contrast material is seen within the brain on postcontrast sequences, limiting assessment. Cerebral volume within normal limits. There are scattered patchy T2/FLAIR hyperintensities involving the periventricular and deep white matter of both cerebral hemispheres. A few probable scattered subtle juxtacortical lesions noted as well. Several of these foci radiate from the lateral ventricles in a perpendicular fashion. A few infratentorial lesions involving the left dorsal midbrain and right pons noted (series 12, images 9, 7). For reference purposes, the largest discrete lesion seen at the deep white matter of the right frontal centrum semi ovale and measures 1 cm (series 12, image 15). Multiple corresponding T1 black holes. Findings are most consistent with demyelinating disease/multiple sclerosis. Several of these lesions demonstrate associated T2 shine through, with no definite restricted diffusion to suggest active demyelination. Again, evaluation for associated enhancement is fairly limited on this exam as little to no contrast material is on board for postcontrast sequences. No evidence for acute or subacute infarct. Gray-white matter differentiation maintained. No encephalomalacia to suggest chronic cortical infarction. No foci of susceptibility artifact to suggest acute or chronic intracranial hemorrhage.  No mass lesion, midline shift or mass effect. No hydrocephalus or extra-axial fluid collection. Pituitary gland suprasellar region normal. Midline structures intact. Vascular: Major intracranial vascular flow voids are maintained. Skull and upper cervical  spine: Craniocervical junction within normal limits. Bone marrow signal intensity normal. No scalp soft tissue abnormality. Other: No mastoid effusion.  Inner ear structures grossly normal. MRI ORBITS FINDINGS Orbits: Globes are symmetric in size with normal morphology bilaterally. While evaluation for possible optic neuritis is limited given the relative lack of IV contrast on this exam, there is subtle asymmetric enlargement of the right optic nerve as compared to the left. Additionally, the nerve and/or nerve sheath itself appears somewhat fuzzy and irregular as compared to the contralateral left nerve (series 2, image 8). Findings also seen on corresponding coronal sequence (series 5, image 18). Given the patient history and corresponding findings in the brain, findings are highly suspicious for acute optic neuritis. No abnormality seen about the orbital apices. Optic chiasm normally situated within the suprasellar cistern without abnormality. No sellar or suprasellar mass. Extra-ocular muscles symmetric and within normal limits. Lacrimal glands normal. Superior orbital veins symmetric and within normal limits. No abnormality about the cavernous sinus. Visualized sinuses: Visualized paranasal sinuses are clear. Soft tissues: Unremarkable. IMPRESSION: 1. Technically limited exam as while contrast was administered for this study, little to no contrast is seen onboard on postcontrast sequences, somewhat limiting assessment. 2. 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 No definite evidence for active demyelination on this limited study. 3. Subtle asymmetric enlargement and irregularity of the right optic nerve as above, highly suspicious for acute optic neuritis, particularly given the patient history and concomitant findings within the brain. Electronically Signed   By: Rise Mu M.D.   On: 01/17/2021 03:28   DG Chest Port 1 View  Result Date: 01/17/2021 CLINICAL DATA:  Optic neuritis EXAM: PORTABLE CHEST 1 VIEW COMPARISON:  None. FINDINGS: The heart size and mediastinal contours are within normal limits. No evidence of hilar or mediastinal adenopathy. Both lungs are clear. Right cervical rib. Artifact from EKG leads. IMPRESSION: Negative chest. Electronically Signed   By: Marnee Spring M.D.   On: 01/17/2021 04:33   MR ORBITS W CONTRAST  Result Date: 01/17/2021 CLINICAL DATA:  Vision loss, monocular. EXAM: MRI HEAD AND ORBITS WITH CONTRAST TECHNIQUE: Multiplanar, multiecho pulse sequences of the brain and surrounding structures were obtained with intravenous contrast. Multiplanar, multiecho pulse sequences of the orbits and surrounding structures were obtained including fat saturation techniques, after intravenous contrast administration. CONTRAST:  94mL GADAVIST GADOBUTROL 1 MMOL/ML IV SOLN COMPARISON:  Same day MRI head/orbits. FINDINGS: Postcontrast imaging was performed to further evaluate findings on same day MRI due to poor contrast bolus timing on that study. There is asymmetric enhancement of the prechiasmatic, intracanalicular and intraorbital right optic nerve. The optic chiasm and left optic nerves do not appear to enhance. There also appears to be mild enhancement of the optic nerve sheath in these regions. No masslike enhancement. As mentioned on same day MRI, the right optic nerve is mildly enlarged diffusely relative to the left. Surrounding intra orbital fat appears somewhat fuzzy on the right. Question faint enhancement of a small right periventricular lesion (series 7, image 9). Otherwise, no abnormal enhancement outside of the orbits/optic nerves. IMPRESSION: 1. Postcontrast imaging of the head and orbits was performed to further evaluate finding seen on same day MRI. There is asymmetric enhancement and  mild enlargement of the prechiasmatic,  intracanalicular, and intraorbital right optic nerve, favored to represent optic neuritis from demyelination given white matter lesions suggestive of demyelination on the same day MRI. Idiopathic perineuritis (pseudotumor) or infectious optic neuritis are thought less likely. 2. Question faint enhancement of a small right periventricular lesion (series 7, image 9), which may represent an area of active demyelination. Otherwise, no abnormal enhancement outside of the orbits/optic nerves. Electronically Signed   By: Feliberto Harts MD   On: 01/17/2021 07:43   MR ORBITS W WO CONTRAST  Result Date: 01/17/2021 CLINICAL DATA:  Initial evaluation for acute right-sided visual disturbance, suspected optic neuritis. EXAM: MRI HEAD AND ORBITS WITHOUT AND WITH CONTRAST TECHNIQUE: Multiplanar, multiecho pulse sequences of the brain and surrounding structures were obtained without and with intravenous contrast. Multiplanar, multiecho pulse sequences of the orbits and surrounding structures were obtained including fat saturation techniques, before and after intravenous contrast administration. CONTRAST:  5mL GADAVIST GADOBUTROL 1 MMOL/ML IV SOLN COMPARISON:  None available. FINDINGS: MRI HEAD FINDINGS Brain: Examination is somewhat technically limited as while the patient did receive IV contrast material for this exam, little to no contrast material is seen within the brain on postcontrast sequences, limiting assessment. Cerebral volume within normal limits. There are scattered patchy T2/FLAIR hyperintensities involving the periventricular and deep white matter of both cerebral hemispheres. A few probable scattered subtle juxtacortical lesions noted as well. Several of these foci radiate from the lateral ventricles in a perpendicular fashion. A few infratentorial lesions involving the left dorsal midbrain and right pons noted (series 12, images 9, 7). For reference purposes, the largest discrete lesion seen at the deep white  matter of the right frontal centrum semi ovale and measures 1 cm (series 12, image 15). Multiple corresponding T1 black holes. Findings are most consistent with demyelinating disease/multiple sclerosis. Several of these lesions demonstrate associated T2 shine through, with no definite restricted diffusion to suggest active demyelination. Again, evaluation for associated enhancement is fairly limited on this exam as little to no contrast material is on board for postcontrast sequences. No evidence for acute or subacute infarct. Gray-white matter differentiation maintained. No encephalomalacia to suggest chronic cortical infarction. No foci of susceptibility artifact to suggest acute or chronic intracranial hemorrhage. No mass lesion, midline shift or mass effect. No hydrocephalus or extra-axial fluid collection. Pituitary gland suprasellar region normal. Midline structures intact. Vascular: Major intracranial vascular flow voids are maintained. Skull and upper cervical spine: Craniocervical junction within normal limits. Bone marrow signal intensity normal. No scalp soft tissue abnormality. Other: No mastoid effusion.  Inner ear structures grossly normal. MRI ORBITS FINDINGS Orbits: Globes are symmetric in size with normal morphology bilaterally. While evaluation for possible optic neuritis is limited given the relative lack of IV contrast on this exam, there is subtle asymmetric enlargement of the right optic nerve as compared to the left. Additionally, the nerve and/or nerve sheath itself appears somewhat fuzzy and irregular as compared to the contralateral left nerve (series 2, image 8). Findings also seen on corresponding coronal sequence (series 5, image 18). Given the patient history and corresponding findings in the brain, findings are highly suspicious for acute optic neuritis. No abnormality seen about the orbital apices. Optic chiasm normally situated within the suprasellar cistern without abnormality. No  sellar or suprasellar mass. Extra-ocular muscles symmetric and within normal limits. Lacrimal glands normal. Superior orbital veins symmetric and within normal limits. No abnormality about the cavernous sinus. Visualized sinuses: Visualized paranasal sinuses are clear. Soft tissues:  Unremarkable. IMPRESSION: 1. Technically limited exam as while contrast was administered for this study, little to no contrast is seen onboard on postcontrast sequences, somewhat limiting assessment. 2. 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 No definite evidence for active demyelination on this limited study. 3. Subtle asymmetric enlargement and irregularity of the right optic nerve as above, highly suspicious for acute optic neuritis, particularly given the patient history and concomitant findings within the brain. Electronically Signed   By: Rise MuBenjamin  McClintock M.D.   On: 01/17/2021 03:28    Pertinent labs & imaging results that were available during my care of the patient were reviewed by me and considered in my medical decision making (see MDM for details).  Medications Ordered in ED Medications  methylPREDNISolone sodium succinate (SOLU-MEDROL) 1,000 mg in sodium chloride 0.9 % 50 mL IVPB (0 mg Intravenous Stopped 01/17/21 0625)  enoxaparin (LOVENOX) injection 40 mg (has no administration in time range)  acetaminophen (TYLENOL) tablet 650 mg (has no administration in time range)    Or  acetaminophen (TYLENOL) suppository 650 mg (has no administration in time range)  vitamin B-12 (CYANOCOBALAMIN) tablet 500 mcg (has no administration in time range)  LORazepam (ATIVAN) injection 1-2 mg (1 mg Intravenous Given 01/17/21 0118)  gadobutrol (GADAVIST) 1 MMOL/ML injection 5 mL (5 mLs Intravenous Contrast Given 01/17/21 0154)                                                                                                                                     Procedures Procedures  (including critical care time)  Medical Decision Making / ED Course I have reviewed the nursing notes for this encounter and the patient's prior records (if available in EHR or on provided paperwork).  Danielle York was evaluated in Emergency Department on 01/17/2021 for the symptoms described in the history of present illness. She was evaluated in the context of the global COVID-19 pandemic, which necessitated consideration that the patient might be at risk for infection with the SARS-CoV-2 virus that causes COVID-19. Institutional protocols and algorithms that pertain to the evaluation of patients at risk for COVID-19 are in a state of rapid change based on information released by regulatory bodies including the CDC and federal and state organizations. These policies and algorithms were followed during the patient's care in the ED.     Pertinent labs & imaging results that were available during my care of the patient were reviewed by me and considered in my medical decision making:  Work-up is consistent with right optic neuritis and evidence of multiple sclerosis throughout the brain.  MRIs not optimal.  Radiology consulted who recommended admission for IV steroids.   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  Despite best  efforts to proofread,  errors can occur which can change the documentation meaning.    Nira Conn, MD 01/17/21 [phone]

## 2021-01-18 LAB — MISC LABCORP TEST (SEND OUT): Labcorp test code: 505310

## 2021-01-18 LAB — BASIC METABOLIC PANEL
Anion gap: 10 (ref 5–15)
BUN: 14 mg/dL (ref 6–20)
CO2: 24 mmol/L (ref 22–32)
Calcium: 9.5 mg/dL (ref 8.9–10.3)
Chloride: 102 mmol/L (ref 98–111)
Creatinine, Ser: 1.04 mg/dL — ABNORMAL HIGH (ref 0.44–1.00)
GFR, Estimated: >60 mL/min (ref >60)
Glucose, Bld: 135 mg/dL — ABNORMAL HIGH (ref 70–99)
Potassium: 3.9 mmol/L (ref 3.5–5.1)
Sodium: 136 mmol/L (ref 135–145)

## 2021-01-18 LAB — VITAMIN D 25 HYDROXY (VIT D DEFICIENCY, FRACTURES): Vit D, 25-Hydroxy: 18.45 ng/mL — ABNORMAL LOW (ref 30–100)

## 2021-01-18 MED ORDER — SODIUM CHLORIDE 0.9 % IV SOLN
1000.0000 mg | Freq: Every day | INTRAVENOUS | Status: AC
  Administered 2021-01-18 – 2021-01-21 (×4): 1000 mg via INTRAVENOUS
  Filled 2021-01-18 (×4): qty 8

## 2021-01-18 MED ORDER — LORAZEPAM 0.5 MG PO TABS
0.5000 mg | ORAL_TABLET | Freq: Two times a day (BID) | ORAL | Status: DC | PRN
  Administered 2021-01-18: 0.5 mg via ORAL
  Filled 2021-01-18: qty 1

## 2021-01-18 MED ORDER — VITAMIN D 25 MCG (1000 UNIT) PO TABS
1000.0000 [IU] | ORAL_TABLET | Freq: Every day | ORAL | Status: DC
  Administered 2021-01-18 – 2021-01-28 (×11): 1000 [IU] via ORAL
  Filled 2021-01-18 (×11): qty 1

## 2021-01-18 MED ORDER — LORAZEPAM 0.5 MG PO TABS: 0.5000 mg | ORAL_TABLET | Freq: Four times a day (QID) | ORAL | Status: DC | PRN

## 2021-01-18 NOTE — Plan of Care (Signed)
  Problem: Health Behavior/Discharge Planning: Goal: Ability to manage health-related needs will improve Outcome: Progressing   

## 2021-01-18 NOTE — Progress Notes (Signed)
Neurology Progress Note  S: Patient was scrolling on her phone when NP entered. NP asked her not to sit and look at her screen or scroll for hours at a time. Her vision is not much improved over her arrival. However, she can see light now and knows that the TV is on, but can not make out what is on the screen. Vision remains "dim" and the worse quadrant is central vision about 2 feet or more from her OD. She has normal peripheral vision OD. She is able to tell that fingers are moving in all quadrants, but when hand is still, it is worse. She denies blurred or double vision. OS vision is normal for her. She feels anxious at times about her illness and Ativan helped with that. Slight HA to right head that she thinks is secondary to anxiety.  O: Current vital signs: BP 105/73 (BP Location: Right Arm)   Pulse 74   Temp 98.7 F (37.1 C) (Oral)   Resp 18   Ht 5\' 6"  (1.676 m)   Wt 57.2 kg   LMP 01/11/2021 (Approximate)   SpO2 99%   BMI 20.34 kg/m  Vital signs in last 24 hours: Temp:  [97.9 F (36.6 C)-99.2 F (37.3 C)] 98.7 F (37.1 C) (08/02 0812) Pulse Rate:  [58-123] 74 (08/02 0812) Resp:  [16-20] 18 (08/02 0812) BP: (99-142)/(58-88) 105/73 (08/02 0812) SpO2:  [90 %-100 %] 99 % (08/02 03-22-1975)  GENERAL: Very well appearing young female. Awake, alert in NAD. HEENT: Normocephalic and atraumatic. LUNGS: Normal respiratory effort.  CV: RRR. Ext: warm. Psych: get teary eyed when discussing her problem.   NEURO:  Mental Status: Alert and oriented x 3. Speech/Language: speech is without aphasia or dysarthria.  Naming, repetition, fluency, and comprehension intact.  Cranial Nerves:  PERRL. Visual fields full with finger testing. Dim vision to still hand in OD central vision. EOMI. Sensation intact and symmetrical to face and extremities. No facial droop. Hearing intact to voice. Phonation is normal.  Medications   Current Facility-Administered Medications:    acetaminophen (TYLENOL)  tablet 650 mg, 650 mg, Oral, Q6H PRN **OR** acetaminophen (TYLENOL) suppository 650 mg, 650 mg, Rectal, Q6H PRN, [id number], MD   enoxaparin (LOVENOX) injection 40 mg, 40 mg, Subcutaneous, Q24H, John Giovanni, MD   loratadine (CLARITIN) tablet 10 mg, 10 mg, Oral, Daily PRN, John Giovanni, MD, 10 mg at 01/17/21 2058   LORazepam (ATIVAN) tablet 0.5 mg, 0.5 mg, Oral, Q6H PRN, Kirby-Graham, 2059, NP   methylPREDNISolone sodium succinate (SOLU-MEDROL) 1,000 mg in sodium chloride 0.9 % 50 mL IVPB, 1,000 mg, Intravenous, Daily, Beather Arbour, MD   vitamin B-12 (CYANOCOBALAMIN) tablet 500 mcg, 500 mcg, Oral, Daily, Erick Blinks, MD, 500 mcg at 01/17/21 1056  Pertinent Labs Vit D low at 18.45     HIV NR     Vit B12 408   Imaging MD has reviewed images in epic and the results pertinent to this consultation are:  MRI Brain and Orbits:   Brain:  -Examination is somewhat technically limited as while the patient did receive IV contrast material for this exam, little to no contrast material is seen within the brain on postcontrast sequences, limiting assessment. -Cerebral volume within normal limits. There are scattered patchy T2/FLAIR hyperintensities involving the periventricular and deep white matter of both cerebral hemispheres. A few probable scattered subtle juxtacortical lesions noted as well. Several of these foci radiate from the lateral ventricles in a perpendicular fashion. A few infratentorial lesions involving  the left dorsal midbrain and right pons noted (series 12, images 9, 7). For reference purposes, the largest discrete lesion seen at the deep white matter of the right frontal centrum semi ovale and measures 1 cm (series 12, image 15). Multiple corresponding T1 black holes. Findings are most consistent with demyelinating disease/multiple sclerosis. Several of these lesions demonstrate associated T2 shine through, with no definite restricted diffusion  to suggest active demyelination. Again, evaluation for associated enhancement is fairly limited on this exam as little to no contrast material is on board for postcontrast sequences. Orbits:  -Post contrast imaging of the head and orbits was performed to further evaluate finding seen on same day MRI. There is asymmetric enhancement and mild enlargement of the prechiasmatic, intracanalicular, and intraorbital right optic nerve, favored to represent optic neuritis from demyelination given white matter lesions suggestive of demyelination on the same day MRI. Idiopathic perineuritis (pseudotumor) or infectious optic neuritis are thought less likely. -Question faint enhancement of a small right periventricular lesion (series 7, image 9), which may represent an area of active demyelination. Otherwise, no abnormal enhancement outside of the orbits/optic nerves.  Assessment: 30 yo female who presented with painful vision loss of OD. 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 She was started on steroids, just yesterday, with not much improvement thus far. she can see light on TV, but not make out faces/objects. I think it is appropriate to keep her inpatient for steroids given the importance of vision for functioning and the fact that she has not shown major improvement yet. If she does not show significant improvement by 3rd or 4th dose of steroids, will need to start her on PLEX.  She has multiple non enhancing T2/FLAIR lesions on MRI along with an enhancement of the R optic nerve. The presence of enhancing and multiple non enhancing lesions simultaneously on MRI provides evidence for dissemination in time and space. 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  Impression: -optic neuritis OD.  - Multiple scerosis. -NMSOD vs. MOG.  -anxiety.  -Vitamin B12 deficiency.   Recommendations/Plan:  -Continue IV Solumderol 1G x 5 doses. - May need  PLEX if her vision does not improve by 3rd or 4th dose of steroids. -NMOSD and MOG Ab panel pending. - Lyme, ACE pending. -[facility] imaging as an out patient. -f/up with Dr. Despina Arias with [facility] out patient.   -Ativan 0.5mg  po q6 hours prn.  -Continue B12 supplementation.  - Vit D levels are low, recommend supplementation.  Erick Blinks [facility] Pager Number [phone]

## 2021-01-18 NOTE — Progress Notes (Signed)
Pt is concerned about her left eye. She said she is feeling pain 0.5/10. She also said she might be over thinking her situation.

## 2021-01-18 NOTE — Progress Notes (Signed)
PROGRESS NOTE                                                                                                                                                                                                             Patient Demographics:    Danielle York, is a 30 y.o. female, DOB - 09-Jun-1991, KKX:[id number]  Outpatient Primary MD for the patient is Pcp, No    LOS - 1  Admit date - 01/16/2021    Chief Complaint  Patient presents with   Eye Problem       Brief Narrative (HPI from H&P)  Danielle York is a 30 y.o. 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   Subjective:   Patient in bed, appears comfortable, denies any headache, no fever, no chest pain or pressure, no shortness of breath , no abdominal pain. No new focal weakness. R eye vision poor especially in the nasal field.     Assessment  & Plan :   Acute right eye pain along with right eye vision loss - MRI suggestive of right eye optic neuritis along with some questionable demyelinating process in the brain as well, neurology following the patient currently on 4 days of IV steroid regimen, vision has slightly improved on 01/17/2021 we will continue to monitor.  2.  Low vitamin D levels and borderline vitamin B12 levels.  Placed on replacement.       Condition - Fair  Family Communication  : Mother Dois Davenport [phone] on 01/17/2021  Code Status :  Full  Consults  :  Neuro  PUD Prophylaxis : None   Procedures  :            Disposition Plan  :    Status is: Inpatient  Remains inpatient appropriate because:IV treatments appropriate due to intensity of illness or inability to take PO  Dispo: The patient is from: Home               Anticipated d/c is to: Home              Patient currently is not medically stable to d/c.   Difficult to place  patient Yes   DVT Prophylaxis  :    enoxaparin (LOVENOX) injection 40 mg Start: 01/17/21 1000    Lab Results  Component Value Date   PLT 223 01/16/2021    Diet :  Diet Order             Diet Heart Room service appropriate? Yes; Fluid consistency: Thin  Diet effective now                    Inpatient Medications  Scheduled Meds:  cholecalciferol  1,000 Units Oral Daily   enoxaparin (LOVENOX) injection  40 mg Subcutaneous Q24H   vitamin B-12  500 mcg Oral Daily   Continuous Infusions:  methylPREDNISolone (SOLU-MEDROL) injection     PRN Meds:.acetaminophen **OR** acetaminophen, loratadine, LORazepam  Antibiotics  :    Anti-infectives (From admission, onward)    None        Time Spent in minutes  30   Susa RaringPrashant Tiare Rohlman M.D on 01/18/2021 at 9:39 AM  To page go to [email or web address]   [facility] -  Office  [phone]    See all Orders from today for further details    Objective:   Vitals:   01/17/21 2039 01/18/21 0003 01/18/21 0423 01/18/21 0812  BP: 123/81 115/76 121/71 105/73  Pulse: 84 70 63 74  Resp: 18 18 18 18   Temp: 97.9 F (36.6 C) 98.3 F (36.8 C) 98 F (36.7 C) 98.7 F (37.1 C)  TempSrc: Oral Oral Oral Oral  SpO2: 99% 95% 97% 99%  Weight:      Height:        Wt Readings from Last 3 Encounters:  01/16/21 57.2 kg     Intake/Output Summary (Last 24 hours) at 01/18/2021 0939 Last data filed at 01/17/2021 1721 Gross per 24 hour  Intake 46.15 ml  Output --  Net 46.15 ml     Physical Exam  Awake Alert, No new F.N deficits, Normal affect, R Nasal hemianopia [place].AT,PERRAL Supple Neck,No JVD, No cervical lymphadenopathy appriciated.  Symmetrical Chest wall movement, Good air movement bilaterally, CTAB RRR,No Gallops, Rubs or new Murmurs, No Parasternal Heave +ve B.Sounds, Abd Soft, No tenderness, No organomegaly  appriciated, No rebound - guarding or rigidity. No Cyanosis, Clubbing or edema, No new Rash or bruise    Data Review:    CBC Recent Labs  Lab 01/16/21 2142  WBC 4.4  HGB 12.6  HCT 41.2  PLT 223  MCV 84.1  MCH 25.7*  MCHC 30.6  RDW 13.2  LYMPHSABS 2.0  MONOABS 0.3  EOSABS 0.1  BASOSABS 0.0    Recent Labs  Lab 01/16/21 2142 01/18/21 0730  NA 138 136  K 3.7 3.9  CL 104 102  CO2 26 24  GLUCOSE 97 135*  BUN 8 14  CREATININE 1.10* 1.04*  CALCIUM 9.7 9.5  AST 28  --   ALT 21  --   ALKPHOS 30*  --   BILITOT 1.1  --   ALBUMIN 4.2  --     ------------------------------------------------------------------------------------------------------------------ No results for input(s): CHOL, HDL, LDLCALC, TRIG, CHOLHDL, LDLDIRECT in the last 72 hours.  No results found for: HGBA1C ------------------------------------------------------------------------------------------------------------------ No results for input(s): TSH, T4TOTAL, T3FREE, THYROIDAB in the last 72 hours.  Invalid input(s): FREET3  Cardiac Enzymes No results for input(s): CKMB, TROPONINI, MYOGLOBIN in the last 168 hours.  Invalid input(s): CK ------------------------------------------------------------------------------------------------------------------ No results found for: BNP  Micro Results Recent Results (from the past 240 hour(s))  SARS CORONAVIRUS 2 (TAT 6-24  HRS) Nasopharyngeal Nasopharyngeal Swab     Status: None   Collection Time: 01/17/21  3:47 AM   Specimen: Nasopharyngeal Swab  Result Value Ref Range Status   SARS Coronavirus 2 NEGATIVE NEGATIVE Final    Comment: (NOTE) SARS-CoV-2 target nucleic acids are NOT DETECTED.  The SARS-CoV-2 RNA is generally detectable in upper and lower respiratory specimens during the acute phase of infection. Negative results do not preclude SARS-CoV-2 infection, do not rule out co-infections with other pathogens, and should not be used as the sole basis  for treatment or other patient management decisions. Negative results must be combined with clinical observations, patient history, and epidemiological information. The expected result is Negative.  Fact Sheet for Patients: HairSlick.no  Fact Sheet for Healthcare Providers: quierodirigir.com  This test is not yet approved or cleared by the [place] FDA and  has been authorized for detection and/or diagnosis of SARS-CoV-2 by FDA under an Emergency Use Authorization (EUA). This EUA will remain  in effect (meaning this test can be used) for the duration of the COVID-19 declaration under Se ction 564(b)(1) of the Act, 21 U.S.C. section 360bbb-3(b)(1), unless the authorization is terminated or revoked sooner.  Performed at [facility], [address], [place], [place] [address]     Radiology Reports MR BRAIN W CONTRAST  Result Date: 01/17/2021 CLINICAL DATA:  Vision loss, monocular. EXAM: MRI HEAD AND ORBITS WITH CONTRAST TECHNIQUE: Multiplanar, multiecho pulse sequences of the brain and surrounding structures were obtained with intravenous contrast. Multiplanar, multiecho pulse sequences of the orbits and surrounding structures were obtained including fat saturation techniques, after intravenous contrast administration. CONTRAST:  56mL GADAVIST GADOBUTROL 1 MMOL/ML IV SOLN COMPARISON:  Same day MRI head/orbits. FINDINGS: Postcontrast imaging was performed to further evaluate findings on same day MRI due to poor contrast bolus timing on that study. There is asymmetric enhancement of the prechiasmatic, intracanalicular and intraorbital right optic nerve. The optic chiasm and left optic nerves do not appear to enhance. There also appears to be mild enhancement of the optic nerve sheath in these regions. No masslike enhancement. As mentioned on same day MRI, the right optic nerve is mildly enlarged diffusely relative to the left.  Surrounding intra orbital fat appears somewhat fuzzy on the right. Question faint enhancement of a small right periventricular lesion (series 7, image 9). Otherwise, no abnormal enhancement outside of the orbits/optic nerves. IMPRESSION: 1. Postcontrast imaging of the head and orbits was performed to further evaluate finding seen on same day MRI. There is asymmetric enhancement and mild enlargement of the prechiasmatic, intracanalicular, and intraorbital right optic nerve, favored to represent optic neuritis from demyelination given white matter lesions suggestive of demyelination on the same day MRI. Idiopathic perineuritis (pseudotumor) or infectious optic neuritis are thought less likely. 2. Question faint enhancement of a small right periventricular lesion (series 7, image 9), which may represent an area of active demyelination. Otherwise, no abnormal enhancement outside of the orbits/optic nerves. Electronically Signed   By: Feliberto Harts MD   On: 01/17/2021 07:43   MR Brain W and Wo Contrast  Result Date: 01/17/2021 CLINICAL DATA:  Initial evaluation for acute right-sided visual disturbance, suspected optic neuritis. EXAM: MRI HEAD AND ORBITS WITHOUT AND WITH CONTRAST TECHNIQUE: Multiplanar, multiecho pulse sequences of the brain and surrounding structures were obtained without and with intravenous contrast. Multiplanar, multiecho pulse sequences of the orbits and surrounding structures were obtained including fat saturation techniques, before and after intravenous contrast administration. CONTRAST:  26mL GADAVIST  GADOBUTROL 1 MMOL/ML IV SOLN COMPARISON:  None available. FINDINGS: MRI HEAD FINDINGS Brain: Examination is somewhat technically limited as while the patient did receive IV contrast material for this exam, little to no contrast material is seen within the brain on postcontrast sequences, limiting assessment. Cerebral volume within normal limits. There are scattered patchy T2/FLAIR  hyperintensities involving the periventricular and deep white matter of both cerebral hemispheres. A few probable scattered subtle juxtacortical lesions noted as well. Several of these foci radiate from the lateral ventricles in a perpendicular fashion. A few infratentorial lesions involving the left dorsal midbrain and right pons noted (series 12, images 9, 7). For reference purposes, the largest discrete lesion seen at the deep white matter of the right frontal centrum semi ovale and measures 1 cm (series 12, image 15). Multiple corresponding T1 black holes. Findings are most consistent with demyelinating disease/multiple sclerosis. Several of these lesions demonstrate associated T2 shine through, with no definite restricted diffusion to suggest active demyelination. Again, evaluation for associated enhancement is fairly limited on this exam as little to no contrast material is on board for postcontrast sequences. No evidence for acute or subacute infarct. Gray-white matter differentiation maintained. No encephalomalacia to suggest chronic cortical infarction. No foci of susceptibility artifact to suggest acute or chronic intracranial hemorrhage. No mass lesion, midline shift or mass effect. No hydrocephalus or extra-axial fluid collection. Pituitary gland suprasellar region normal. Midline structures intact. Vascular: Major intracranial vascular flow voids are maintained. Skull and upper cervical spine: Craniocervical junction within normal limits. Bone marrow signal intensity normal. No scalp soft tissue abnormality. Other: No mastoid effusion.  Inner ear structures grossly normal. MRI ORBITS FINDINGS Orbits: Globes are symmetric in size with normal morphology bilaterally. While evaluation for possible optic neuritis is limited given the relative lack of IV contrast on this exam, there is subtle asymmetric enlargement of the right optic nerve as compared to the left. Additionally, the nerve and/or nerve sheath  itself appears somewhat fuzzy and irregular as compared to the contralateral left nerve (series 2, image 8). Findings also seen on corresponding coronal sequence (series 5, image 18). Given the patient history and corresponding findings in the brain, findings are highly suspicious for acute optic neuritis. No abnormality seen about the orbital apices. Optic chiasm normally situated within the suprasellar cistern without abnormality. No sellar or suprasellar mass. Extra-ocular muscles symmetric and within normal limits. Lacrimal glands normal. Superior orbital veins symmetric and within normal limits. No abnormality about the cavernous sinus. Visualized sinuses: Visualized paranasal sinuses are clear. Soft tissues: Unremarkable. IMPRESSION: 1. Technically limited exam as while contrast was administered for this study, little to no contrast is seen onboard on postcontrast sequences, somewhat limiting assessment. 2. 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 No definite evidence for active demyelination on this limited study. 3. Subtle asymmetric enlargement and irregularity of the right optic nerve as above, highly suspicious for acute optic neuritis, particularly given the patient history and concomitant findings within the brain. Electronically Signed   By: Rise Mu M.D.   On: 01/17/2021 03:28   DG Chest Port 1 View  Result Date: 01/17/2021 CLINICAL DATA:  Optic neuritis EXAM: PORTABLE CHEST 1 VIEW COMPARISON:  None. FINDINGS: The heart size and mediastinal contours are within normal limits. No evidence of hilar or mediastinal adenopathy. Both lungs are clear. Right cervical rib. Artifact from EKG leads. IMPRESSION: Negative chest. Electronically Signed   By: Marja Kays  Watts M.D.   On: 01/17/2021 04:33   MR ORBITS W CONTRAST  Result Date: 01/17/2021 CLINICAL DATA:   Vision loss, monocular. EXAM: MRI HEAD AND ORBITS WITH CONTRAST TECHNIQUE: Multiplanar, multiecho pulse sequences of the brain and surrounding structures were obtained with intravenous contrast. Multiplanar, multiecho pulse sequences of the orbits and surrounding structures were obtained including fat saturation techniques, after intravenous contrast administration. CONTRAST:  51mL GADAVIST GADOBUTROL 1 MMOL/ML IV SOLN COMPARISON:  Same day MRI head/orbits. FINDINGS: Postcontrast imaging was performed to further evaluate findings on same day MRI due to poor contrast bolus timing on that study. There is asymmetric enhancement of the prechiasmatic, intracanalicular and intraorbital right optic nerve. The optic chiasm and left optic nerves do not appear to enhance. There also appears to be mild enhancement of the optic nerve sheath in these regions. No masslike enhancement. As mentioned on same day MRI, the right optic nerve is mildly enlarged diffusely relative to the left. Surrounding intra orbital fat appears somewhat fuzzy on the right. Question faint enhancement of a small right periventricular lesion (series 7, image 9). Otherwise, no abnormal enhancement outside of the orbits/optic nerves. IMPRESSION: 1. Postcontrast imaging of the head and orbits was performed to further evaluate finding seen on same day MRI. There is asymmetric enhancement and mild enlargement of the prechiasmatic, intracanalicular, and intraorbital right optic nerve, favored to represent optic neuritis from demyelination given white matter lesions suggestive of demyelination on the same day MRI. Idiopathic perineuritis (pseudotumor) or infectious optic neuritis are thought less likely. 2. Question faint enhancement of a small right periventricular lesion (series 7, image 9), which may represent an area of active demyelination. Otherwise, no abnormal enhancement outside of the orbits/optic nerves. Electronically Signed   By: Feliberto Harts  MD   On: 01/17/2021 07:43   MR ORBITS W WO CONTRAST  Result Date: 01/17/2021 CLINICAL DATA:  Initial evaluation for acute right-sided visual disturbance, suspected optic neuritis. EXAM: MRI HEAD AND ORBITS WITHOUT AND WITH CONTRAST TECHNIQUE: Multiplanar, multiecho pulse sequences of the brain and surrounding structures were obtained without and with intravenous contrast. Multiplanar, multiecho pulse sequences of the orbits and surrounding structures were obtained including fat saturation techniques, before and after intravenous contrast administration. CONTRAST:  32mL GADAVIST GADOBUTROL 1 MMOL/ML IV SOLN COMPARISON:  None available. FINDINGS: MRI HEAD FINDINGS Brain: Examination is somewhat technically limited as while the patient did receive IV contrast material for this exam, little to no contrast material is seen within the brain on postcontrast sequences, limiting assessment. Cerebral volume within normal limits. There are scattered patchy T2/FLAIR hyperintensities involving the periventricular and deep white matter of both cerebral hemispheres. A few probable scattered subtle juxtacortical lesions noted as well. Several of these foci radiate from the lateral ventricles in a perpendicular fashion. A few infratentorial lesions involving the left dorsal midbrain and right pons noted (series 12, images 9, 7). For reference purposes, the largest discrete lesion seen at the deep white matter of the right frontal centrum semi ovale and measures 1 cm (series 12, image 15). Multiple corresponding T1 black holes. Findings are most consistent with demyelinating disease/multiple sclerosis. Several of these lesions demonstrate associated T2 shine through, with no definite restricted diffusion to suggest active demyelination. Again, evaluation for associated enhancement is fairly limited on this exam as little to no contrast material is on board for postcontrast sequences. No evidence for acute or subacute infarct.  Gray-white matter differentiation maintained. No encephalomalacia to suggest chronic cortical infarction. No foci of susceptibility  artifact to suggest acute or chronic intracranial hemorrhage. No mass lesion, midline shift or mass effect. No hydrocephalus or extra-axial fluid collection. Pituitary gland suprasellar region normal. Midline structures intact. Vascular: Major intracranial vascular flow voids are maintained. Skull and upper cervical spine: Craniocervical junction within normal limits. Bone marrow signal intensity normal. No scalp soft tissue abnormality. Other: No mastoid effusion.  Inner ear structures grossly normal. MRI ORBITS FINDINGS Orbits: Globes are symmetric in size with normal morphology bilaterally. While evaluation for possible optic neuritis is limited given the relative lack of IV contrast on this exam, there is subtle asymmetric enlargement of the right optic nerve as compared to the left. Additionally, the nerve and/or nerve sheath itself appears somewhat fuzzy and irregular as compared to the contralateral left nerve (series 2, image 8). Findings also seen on corresponding coronal sequence (series 5, image 18). Given the patient history and corresponding findings in the brain, findings are highly suspicious for acute optic neuritis. No abnormality seen about the orbital apices. Optic chiasm normally situated within the suprasellar cistern without abnormality. No sellar or suprasellar mass. Extra-ocular muscles symmetric and within normal limits. Lacrimal glands normal. Superior orbital veins symmetric and within normal limits. No abnormality about the cavernous sinus. Visualized sinuses: Visualized paranasal sinuses are clear. Soft tissues: Unremarkable. IMPRESSION: 1. Technically limited exam as while contrast was administered for this study, little to no contrast is seen onboard on postcontrast sequences, somewhat limiting assessment. 2. Patchy T2/FLAIR hyperintensities involving the  supratentorial and infratentorial cerebral white matter as above, most consistent with demyelinating disease/multiple sclerosis. No definite evidence for active demyelination on this limited study. 3. Subtle asymmetric enlargement and irregularity of the right optic nerve as above, highly suspicious for acute optic neuritis, particularly given the patient history and concomitant findings within the brain. Electronically Signed   By: Rise Mu M.D.   On: 01/17/2021 03:28

## 2021-01-19 LAB — NEUROMYELITIS OPTICA AUTOAB, IGG: NMO-IgG: <1.5 U/mL (ref 0.0–3.0)

## 2021-01-19 LAB — ANGIOTENSIN CONVERTING ENZYME: Angiotensin-Converting Enzyme: 23 U/L (ref 14–82)

## 2021-01-19 NOTE — Progress Notes (Addendum)
Informed pt neurologist plan to continue solumedrol iv for until 8/5. Pt states md informed her he was going to "pull the plug on the solumedrol" and discussed tx for plasma exchange scheduled for tomorrwow

## 2021-01-19 NOTE — Progress Notes (Signed)
NEUROLOGY CONSULTATION PROGRESS NOTE   Date of service: January 19, 2021 Patient Name: Danielle York MRN:  [id number] DOB:  1990/09/14  Brief HPI  30 yo female who presented with painful vision loss of OD. 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  She has multiple non enhancing T2/FLAIR lesions on MRI along with an enhancement of the R optic nerve. The presence of enhancing and multiple non enhancing lesions simultaneously on MRI provides evidence for dissemination in time and space. 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  She is on IV Solumedrol 1000mg  daily x 5 doses. She took her 3rd dose this AM. Pain is improved but minimal improvement in her vision.   Interval Hx   Central vision is still significantly impaired. Peripheral is somewhat better. Can make out finger by loking at the outline but still cant identify the obcejts.  I spoke to her about plasma exchange and she instead directed me to talk to her mom due to her severe anxiety and asked for her to be her proxy in making the decision regarding opting for further treatment with plasma exchange.  Vitals   Vitals:   01/18/21 1208 01/18/21 2306 01/19/21 0530 01/19/21 1210  BP: 94/63 117/74 109/67 98/64  Pulse: 65 60 62 64  Resp: 18 18 16 16   Temp: 99 F (37.2 C) 98.4 F (36.9 C) 98 F (36.7 C) 98.3 F (36.8 C)  TempSrc: Oral Oral Oral Oral  SpO2: 99% 96% 96% 98%  Weight:      Height:         Body mass index is 20.34 kg/m.  Physical Exam   General: walking around in the room; in no acute distress.  HENT: Normal external appearance of ears and nose.  CV: No JVD. No peripheral edema.  Pulmonary: Symmetric Chest rise. Normal respiratory effort.  Ext: No cyanosis, edema, or deformity  Skin: No rash. Normal palpation of skin.   Neurologic Examination  Mental status/Cognition: Alert, conversant, oriented to self. Speech/language: Fluent, comprehension  intact. Cranial nerves:   CN II Pupils equal and reactive to light, R APD. Can make out the outlines of objects with central vision but not much else.   CN III,IV,VI EOM intact, no gaze preference or deviation, no nystagmus   CN V    CN VII no asymmetry, no nasolabial fold flattening   CN VIII    CN IX & X    CN XI 5/5 head turn and 5/5 shoulder shrug bilaterally   CN XII    Motor:  Muscle bulk: normal, tone normal Mvmt Root Nerve  Muscle Right Left Comments  SA C5/6 Ax Deltoid     EF C5/6 Mc Biceps     EE C6/7/8 Rad Triceps     WF C6/7 Med FCR     WE C7/8 PIN ECU     F Ab C8/T1 U ADM/FDI 5 5   HF L1/2/3 Fem Illopsoas 5 5   KE L2/3/4 Fem Quad     DF L4/5 D Peron Tib Ant     PF S1/2 Tibial Grc/Sol       Coordination/Complex Motor:  - Gait: Stride length normal. Arm swing normal. Base width narrow.  Labs   Basic Metabolic Panel:  Lab Results  Component Value Date   NA 136 01/18/2021   K 3.9 01/18/2021   CO2 24 01/18/2021   GLUCOSE 135 (H) 01/18/2021   BUN 14 01/18/2021   CREATININE 1.04 (  H) 01/18/2021   CALCIUM 9.5 01/18/2021   GFRNONAA >60 01/18/2021   HbA1c: No results found for: HGBA1C LDL: No results found for: [facility] Urine Drug Screen: No results found for: LABOPIA, COCAINSCRNUR, LABBENZ, AMPHETMU, THCU, LABBARB  Alcohol Level No results found for: ETH No results found for: PHENYTOIN, ZONISAMIDE, LAMOTRIGINE, LEVETIRACETA No results found for: PHENYTOIN, PHENOBARB, VALPROATE, CBMZ  Imaging and Diagnostic studies   MRI Brain and Orbits:   Brain:  -Examination is somewhat technically limited as while the patient did receive IV contrast material for this exam, little to no contrast material is seen within the brain on postcontrast sequences, limiting assessment. -Cerebral volume within normal limits. There are scattered patchy T2/FLAIR hyperintensities involving the periventricular and deep white matter of both cerebral hemispheres. A few probable  scattered subtle juxtacortical lesions noted as well. Several of these foci radiate from the lateral ventricles in a perpendicular fashion. A few infratentorial lesions involving the left dorsal midbrain and right pons noted (series 12, images 9, 7). For reference purposes, the largest discrete lesion seen at the deep white matter of the right frontal centrum semi ovale and measures 1 cm (series 12, image 15). Multiple corresponding T1 black holes. Findings are most consistent with demyelinating disease/multiple sclerosis. Several of these lesions demonstrate associated T2 shine through, with no definite restricted diffusion to suggest active demyelination. Again, evaluation for associated enhancement is fairly limited on this exam as little to no contrast material is on board for postcontrast sequences. Orbits: -Post contrast imaging of the head and orbits was performed to further evaluate finding seen on same day MRI. There is asymmetric enhancement and mild enlargement of the prechiasmatic, intracanalicular, and intraorbital right optic nerve, favored to represent optic neuritis from demyelination given white matter lesions suggestive of demyelination on the same day MRI. Idiopathic perineuritis (pseudotumor) or infectious optic neuritis are thought less likely. -Question faint enhancement of a small right periventricular lesion (series 7, image 9), which may represent an area of active demyelination. Otherwise, no abnormal enhancement outside of the orbits/optic nerves.   Impression   Danielle York is a 30 y.o. female presented with painful vision loss of OD. 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 Simultaneous presence of enhancing R optic nerve along with multiple non enhancing lesions on MRI provides evidence for dissemination in time and space. She fits the 2017 McDonald criteria for diagnosis of Multiple Sclerosis.Marland Kitchen Her neurologic examination is  notable for R eye central greater than peripheral vision loss.  R eye pain is improved with steroids but despite getting 3 doses of steroids yet, has not shown any significant improvement in R eye vision.  Recommendations  - Continue IV solumedrol daily x 5 days, last dose on 8/5. - discussed with patient's mom regarding PLEX and she opted to go with further treatment with plasma exchange at this time. I will place the appropriate PLEX orders. - Recommend consult to IR for HD cath placement. ______________________________________________________________________   Thank you for the opportunity to take part in the care of this patient. If you have any further questions, please contact the neurology consultation attending.  Signed,  Erick Blinks [facility] Pager Number [phone]

## 2021-01-19 NOTE — Progress Notes (Signed)
PROGRESS NOTE                                                                                                                                                                                                             Patient Demographics:    Danielle York, is a 30 y.o. female, DOB - 1990-08-16, WJX:[id number]  Outpatient Primary MD for the patient is Pcp, No    LOS - 2  Admit date - 01/16/2021    Chief Complaint  Patient presents with   Eye Problem       Brief Narrative (HPI from H&P)  Danielle York is a 30 y.o. 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   Subjective:   Patient in bed, appears comfortable, denies any headache, no fever, no chest pain or pressure, no shortness of breath , no abdominal pain. No new focal weakness. R eye vision poor especially in the nasal field.    Assessment  & Plan :   Acute right eye pain along with right eye vision loss - MRI suggestive of right eye optic neuritis along with some questionable demyelinating process in the brain as well, neurology following the patient currently on 5 days of IV steroid regimen, vision minimally better at best, DW Neuro 01/19/21 may need PLEX.  2.  Low vitamin D levels and borderline vitamin B12 levels.  Placed on replacement.       Condition - Fair  Family Communication  : Mother Danielle York [phone] on 01/17/2021, 01/19/21  Code Status :  Full  Consults  :  Neuro  PUD Prophylaxis : None   Procedures  :            Disposition Plan  :    Status is: Inpatient  Remains inpatient appropriate because:IV treatments appropriate due to intensity of illness or inability to take PO  Dispo: The patient is from: Home               Anticipated d/c is to: Home              Patient currently is not medically stable to d/c.   Difficult to place  patient Yes   DVT Prophylaxis  :    enoxaparin (LOVENOX) injection 40 mg Start: 01/17/21 1000    Lab Results  Component Value Date   PLT 223 01/16/2021    Diet :  Diet Order             Diet Heart Room service appropriate? Yes; Fluid consistency: Thin  Diet effective now                    Inpatient Medications  Scheduled Meds:  cholecalciferol  1,000 Units Oral Daily   enoxaparin (LOVENOX) injection  40 mg Subcutaneous Q24H   vitamin B-12  500 mcg Oral Daily   Continuous Infusions:  methylPREDNISolone (SOLU-MEDROL) injection Stopped (01/18/21 1059)   PRN Meds:.acetaminophen **OR** acetaminophen, loratadine, LORazepam  Antibiotics  :    Anti-infectives (From admission, onward)    None        Time Spent in minutes  30   Susa Raring M.D on 01/19/2021 at 9:58 AM  To page go to [email or web address]   [facility] -  Office  [phone]    See all Orders from today for further details    Objective:   Vitals:   01/18/21 0812 01/18/21 1208 01/18/21 2306 01/19/21 0530  BP: 105/73 94/63 117/74 109/67  Pulse: 74 65 60 62  Resp: 18 18 18 16   Temp: 98.7 F (37.1 C) 99 F (37.2 C) 98.4 F (36.9 C) 98 F (36.7 C)  TempSrc: Oral Oral Oral Oral  SpO2: 99% 99% 96% 96%  Weight:      Height:        Wt Readings from Last 3 Encounters:  01/16/21 57.2 kg     Intake/Output Summary (Last 24 hours) at 01/19/2021 0958 Last data filed at 01/18/2021 1542 Gross per 24 hour  Intake 58 ml  Output --  Net 58 ml     Physical Exam  Awake Alert, No new F.N deficits, Normal affect, R Nasal hemianopia Vamo.AT,PERRAL Supple Neck,No JVD, No cervical lymphadenopathy appriciated.  Symmetrical Chest wall movement, Good air movement bilaterally, CTAB RRR,No Gallops, Rubs or new Murmurs, No Parasternal Heave +ve B.Sounds, Abd Soft, No tenderness, No  organomegaly appriciated, No rebound - guarding or rigidity. No Cyanosis, Clubbing or edema, No new Rash or bruise     Data Review:    CBC Recent Labs  Lab 01/16/21 2142  WBC 4.4  HGB 12.6  HCT 41.2  PLT 223  MCV 84.1  MCH 25.7*  MCHC 30.6  RDW 13.2  LYMPHSABS 2.0  MONOABS 0.3  EOSABS 0.1  BASOSABS 0.0    Recent Labs  Lab 01/16/21 2142 01/18/21 0730  NA 138 136  K 3.7 3.9  CL 104 102  CO2 26 24  GLUCOSE 97 135*  BUN 8 14  CREATININE 1.10* 1.04*  CALCIUM 9.7 9.5  AST 28  --   ALT 21  --   ALKPHOS 30*  --   BILITOT 1.1  --   ALBUMIN 4.2  --     ------------------------------------------------------------------------------------------------------------------ No results for input(s): CHOL, HDL, LDLCALC, TRIG, CHOLHDL, LDLDIRECT in the last 72 hours.  No results found for: HGBA1C ------------------------------------------------------------------------------------------------------------------ No results for input(s): TSH, T4TOTAL, T3FREE, THYROIDAB in the last 72 hours.  Invalid input(s): FREET3  Cardiac Enzymes No results for input(s): CKMB, TROPONINI, MYOGLOBIN in the last 168 hours.  Invalid input(s): CK ------------------------------------------------------------------------------------------------------------------ No results found for: BNP  Micro Results Recent Results (from the past 240 hour(s))  SARS CORONAVIRUS 2 (  TAT 6-24 HRS) Nasopharyngeal Nasopharyngeal Swab     Status: None   Collection Time: 01/17/21  3:47 AM   Specimen: Nasopharyngeal Swab  Result Value Ref Range Status   SARS Coronavirus 2 NEGATIVE NEGATIVE Final    Comment: (NOTE) SARS-CoV-2 target nucleic acids are NOT DETECTED.  The SARS-CoV-2 RNA is generally detectable in upper and lower respiratory specimens during the acute phase of infection. Negative results do not preclude SARS-CoV-2 infection, do not rule out co-infections with other pathogens, and should not be used as  the sole basis for treatment or other patient management decisions. Negative results must be combined with clinical observations, patient history, and epidemiological information. The expected result is Negative.  Fact Sheet for Patients: HairSlick.nohttps://www.fda.gov/media/138098/download  Fact Sheet for Healthcare Providers: quierodirigir.comhttps://www.fda.gov/media/138095/download  This test is not yet approved or cleared by the [place] FDA and  has been authorized for detection and/or diagnosis of SARS-CoV-2 by FDA under an Emergency Use Authorization (EUA). This EUA will remain  in effect (meaning this test can be used) for the duration of the COVID-19 declaration under Se ction 564(b)(1) of the Act, 21 U.S.C. section 360bbb-3(b)(1), unless the authorization is terminated or revoked sooner.  Performed at [facility], [address] [place], [place] [address]     Radiology Reports MR BRAIN W CONTRAST  Result Date: 01/17/2021 CLINICAL DATA:  Vision loss, monocular. EXAM: MRI HEAD AND ORBITS WITH CONTRAST TECHNIQUE: Multiplanar, multiecho pulse sequences of the brain and surrounding structures were obtained with intravenous contrast. Multiplanar, multiecho pulse sequences of the orbits and surrounding structures were obtained including fat saturation techniques, after intravenous contrast administration. CONTRAST:  5mL GADAVIST GADOBUTROL 1 MMOL/ML IV SOLN COMPARISON:  Same day MRI head/orbits. FINDINGS: Postcontrast imaging was performed to further evaluate findings on same day MRI due to poor contrast bolus timing on that study. There is asymmetric enhancement of the prechiasmatic, intracanalicular and intraorbital right optic nerve. The optic chiasm and left optic nerves do not appear to enhance. There also appears to be mild enhancement of the optic nerve sheath in these regions. No masslike enhancement. As mentioned on same day MRI, the right optic nerve is mildly enlarged diffusely relative to  the left. Surrounding intra orbital fat appears somewhat fuzzy on the right. Question faint enhancement of a small right periventricular lesion (series 7, image 9). Otherwise, no abnormal enhancement outside of the orbits/optic nerves. IMPRESSION: 1. Postcontrast imaging of the head and orbits was performed to further evaluate finding seen on same day MRI. There is asymmetric enhancement and mild enlargement of the prechiasmatic, intracanalicular, and intraorbital right optic nerve, favored to represent optic neuritis from demyelination given white matter lesions suggestive of demyelination on the same day MRI. Idiopathic perineuritis (pseudotumor) or infectious optic neuritis are thought less likely. 2. Question faint enhancement of a small right periventricular lesion (series 7, image 9), which may represent an area of active demyelination. Otherwise, no abnormal enhancement outside of the orbits/optic nerves. Electronically Signed   By: Feliberto HartsFrederick S Jones MD   On: 01/17/2021 07:43   MR Brain W and Wo Contrast  Result Date: 01/17/2021 CLINICAL DATA:  Initial evaluation for acute right-sided visual disturbance, suspected optic neuritis. EXAM: MRI HEAD AND ORBITS WITHOUT AND WITH CONTRAST TECHNIQUE: Multiplanar, multiecho pulse sequences of the brain and surrounding structures were obtained without and with intravenous contrast. Multiplanar, multiecho pulse sequences of the orbits and surrounding structures were obtained including fat saturation techniques, before and after intravenous contrast administration. CONTRAST:  55mL GADAVIST GADOBUTROL 1 MMOL/ML IV SOLN COMPARISON:  None available. FINDINGS: MRI HEAD FINDINGS Brain: Examination is somewhat technically limited as while the patient did receive IV contrast material for this exam, little to no contrast material is seen within the brain on postcontrast sequences, limiting assessment. Cerebral volume within normal limits. There are scattered patchy T2/FLAIR  hyperintensities involving the periventricular and deep white matter of both cerebral hemispheres. A few probable scattered subtle juxtacortical lesions noted as well. Several of these foci radiate from the lateral ventricles in a perpendicular fashion. A few infratentorial lesions involving the left dorsal midbrain and right pons noted (series 12, images 9, 7). For reference purposes, the largest discrete lesion seen at the deep white matter of the right frontal centrum semi ovale and measures 1 cm (series 12, image 15). Multiple corresponding T1 black holes. Findings are most consistent with demyelinating disease/multiple sclerosis. Several of these lesions demonstrate associated T2 shine through, with no definite restricted diffusion to suggest active demyelination. Again, evaluation for associated enhancement is fairly limited on this exam as little to no contrast material is on board for postcontrast sequences. No evidence for acute or subacute infarct. Gray-white matter differentiation maintained. No encephalomalacia to suggest chronic cortical infarction. No foci of susceptibility artifact to suggest acute or chronic intracranial hemorrhage. No mass lesion, midline shift or mass effect. No hydrocephalus or extra-axial fluid collection. Pituitary gland suprasellar region normal. Midline structures intact. Vascular: Major intracranial vascular flow voids are maintained. Skull and upper cervical spine: Craniocervical junction within normal limits. Bone marrow signal intensity normal. No scalp soft tissue abnormality. Other: No mastoid effusion.  Inner ear structures grossly normal. MRI ORBITS FINDINGS Orbits: Globes are symmetric in size with normal morphology bilaterally. While evaluation for possible optic neuritis is limited given the relative lack of IV contrast on this exam, there is subtle asymmetric enlargement of the right optic nerve as compared to the left. Additionally, the nerve and/or nerve sheath  itself appears somewhat fuzzy and irregular as compared to the contralateral left nerve (series 2, image 8). Findings also seen on corresponding coronal sequence (series 5, image 18). Given the patient history and corresponding findings in the brain, findings are highly suspicious for acute optic neuritis. No abnormality seen about the orbital apices. Optic chiasm normally situated within the suprasellar cistern without abnormality. No sellar or suprasellar mass. Extra-ocular muscles symmetric and within normal limits. Lacrimal glands normal. Superior orbital veins symmetric and within normal limits. No abnormality about the cavernous sinus. Visualized sinuses: Visualized paranasal sinuses are clear. Soft tissues: Unremarkable. IMPRESSION: 1. Technically limited exam as while contrast was administered for this study, little to no contrast is seen onboard on postcontrast sequences, somewhat limiting assessment. 2. 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 No definite evidence for active demyelination on this limited study. 3. Subtle asymmetric enlargement and irregularity of the right optic nerve as above, highly suspicious for acute optic neuritis, particularly given the patient history and concomitant findings within the brain. Electronically Signed   By: Rise Mu M.D.   On: 01/17/2021 03:28   DG Chest Port 1 View  Result Date: 01/17/2021 CLINICAL DATA:  Optic neuritis EXAM: PORTABLE CHEST 1 VIEW COMPARISON:  None. FINDINGS: The heart size and mediastinal contours are within normal limits. No evidence of hilar or mediastinal adenopathy. Both lungs are clear. Right cervical rib. Artifact from EKG leads. IMPRESSION: Negative chest. Electronically Signed   By:  Marnee Spring M.D.   On: 01/17/2021 04:33   MR ORBITS W CONTRAST  Result Date: 01/17/2021 CLINICAL DATA:   Vision loss, monocular. EXAM: MRI HEAD AND ORBITS WITH CONTRAST TECHNIQUE: Multiplanar, multiecho pulse sequences of the brain and surrounding structures were obtained with intravenous contrast. Multiplanar, multiecho pulse sequences of the orbits and surrounding structures were obtained including fat saturation techniques, after intravenous contrast administration. CONTRAST:  30mL GADAVIST GADOBUTROL 1 MMOL/ML IV SOLN COMPARISON:  Same day MRI head/orbits. FINDINGS: Postcontrast imaging was performed to further evaluate findings on same day MRI due to poor contrast bolus timing on that study. There is asymmetric enhancement of the prechiasmatic, intracanalicular and intraorbital right optic nerve. The optic chiasm and left optic nerves do not appear to enhance. There also appears to be mild enhancement of the optic nerve sheath in these regions. No masslike enhancement. As mentioned on same day MRI, the right optic nerve is mildly enlarged diffusely relative to the left. Surrounding intra orbital fat appears somewhat fuzzy on the right. Question faint enhancement of a small right periventricular lesion (series 7, image 9). Otherwise, no abnormal enhancement outside of the orbits/optic nerves. IMPRESSION: 1. Postcontrast imaging of the head and orbits was performed to further evaluate finding seen on same day MRI. There is asymmetric enhancement and mild enlargement of the prechiasmatic, intracanalicular, and intraorbital right optic nerve, favored to represent optic neuritis from demyelination given white matter lesions suggestive of demyelination on the same day MRI. Idiopathic perineuritis (pseudotumor) or infectious optic neuritis are thought less likely. 2. Question faint enhancement of a small right periventricular lesion (series 7, image 9), which may represent an area of active demyelination. Otherwise, no abnormal enhancement outside of the orbits/optic nerves. Electronically Signed   By: Feliberto Harts  MD   On: 01/17/2021 07:43   MR ORBITS W WO CONTRAST  Result Date: 01/17/2021 CLINICAL DATA:  Initial evaluation for acute right-sided visual disturbance, suspected optic neuritis. EXAM: MRI HEAD AND ORBITS WITHOUT AND WITH CONTRAST TECHNIQUE: Multiplanar, multiecho pulse sequences of the brain and surrounding structures were obtained without and with intravenous contrast. Multiplanar, multiecho pulse sequences of the orbits and surrounding structures were obtained including fat saturation techniques, before and after intravenous contrast administration. CONTRAST:  80mL GADAVIST GADOBUTROL 1 MMOL/ML IV SOLN COMPARISON:  None available. FINDINGS: MRI HEAD FINDINGS Brain: Examination is somewhat technically limited as while the patient did receive IV contrast material for this exam, little to no contrast material is seen within the brain on postcontrast sequences, limiting assessment. Cerebral volume within normal limits. There are scattered patchy T2/FLAIR hyperintensities involving the periventricular and deep white matter of both cerebral hemispheres. A few probable scattered subtle juxtacortical lesions noted as well. Several of these foci radiate from the lateral ventricles in a perpendicular fashion. A few infratentorial lesions involving the left dorsal midbrain and right pons noted (series 12, images 9, 7). For reference purposes, the largest discrete lesion seen at the deep white matter of the right frontal centrum semi ovale and measures 1 cm (series 12, image 15). Multiple corresponding T1 black holes. Findings are most consistent with demyelinating disease/multiple sclerosis. Several of these lesions demonstrate associated T2 shine through, with no definite restricted diffusion to suggest active demyelination. Again, evaluation for associated enhancement is fairly limited on this exam as little to no contrast material is on board for postcontrast sequences. No evidence for acute or subacute infarct.  Gray-white matter differentiation maintained. No encephalomalacia to suggest chronic cortical infarction. No foci  of susceptibility artifact to suggest acute or chronic intracranial hemorrhage. No mass lesion, midline shift or mass effect. No hydrocephalus or extra-axial fluid collection. Pituitary gland suprasellar region normal. Midline structures intact. Vascular: Major intracranial vascular flow voids are maintained. Skull and upper cervical spine: Craniocervical junction within normal limits. Bone marrow signal intensity normal. No scalp soft tissue abnormality. Other: No mastoid effusion.  Inner ear structures grossly normal. MRI ORBITS FINDINGS Orbits: Globes are symmetric in size with normal morphology bilaterally. While evaluation for possible optic neuritis is limited given the relative lack of IV contrast on this exam, there is subtle asymmetric enlargement of the right optic nerve as compared to the left. Additionally, the nerve and/or nerve sheath itself appears somewhat fuzzy and irregular as compared to the contralateral left nerve (series 2, image 8). Findings also seen on corresponding coronal sequence (series 5, image 18). Given the patient history and corresponding findings in the brain, findings are highly suspicious for acute optic neuritis. No abnormality seen about the orbital apices. Optic chiasm normally situated within the suprasellar cistern without abnormality. No sellar or suprasellar mass. Extra-ocular muscles symmetric and within normal limits. Lacrimal glands normal. Superior orbital veins symmetric and within normal limits. No abnormality about the cavernous sinus. Visualized sinuses: Visualized paranasal sinuses are clear. Soft tissues: Unremarkable. IMPRESSION: 1. Technically limited exam as while contrast was administered for this study, little to no contrast is seen onboard on postcontrast sequences, somewhat limiting assessment. 2. Patchy T2/FLAIR hyperintensities involving the  supratentorial and infratentorial cerebral white matter as above, most consistent with demyelinating disease/multiple sclerosis. No definite evidence for active demyelination on this limited study. 3. Subtle asymmetric enlargement and irregularity of the right optic nerve as above, highly suspicious for acute optic neuritis, particularly given the patient history and concomitant findings within the brain. Electronically Signed   By: Rise Mu M.D.   On: 01/17/2021 03:28

## 2021-01-19 NOTE — Progress Notes (Signed)
Informed pt neurologist will call her mother at the request of her mother to address additional questions she has regarding plasma exchange and catheter insertion scheduled for tomorrow. Pt continues to prefer holding further peripheral iv placement until after her mother converses with neurologist commenting "I just want to make sure everyone is on the same page". Pt stepmother and family member at bedside.

## 2021-01-19 NOTE — Progress Notes (Signed)
Consulted to place PIV. PIV not needed any longer per patient. RN made aware.

## 2021-01-20 LAB — LYME DISEASE DNA BY PCR(BORRELIA BURG): Lyme Disease(B.burgdorferi)PCR: NEGATIVE

## 2021-01-20 LAB — METHYLMALONIC ACID, SERUM: Methylmalonic Acid, Quantitative: 126 nmol/L (ref 0–378)

## 2021-01-20 NOTE — Progress Notes (Signed)
PROGRESS NOTE                                                                                                                                                                                                             Patient Demographics:    Danielle York, is a 30 y.o. female, DOB - 07-May-1991, WOE:[id number]  Outpatient Primary MD for the patient is Pcp, No    LOS - 3  Admit date - 01/16/2021    Chief Complaint  Patient presents with   Eye Problem       Brief Narrative (HPI from H&P)  Danielle York is a 30 y.o. 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   Subjective:   Patient in bed, appears comfortable, denies any headache, no fever, no chest pain or pressure, no shortness of breath , no abdominal pain. No new focal weakness. R eye vision poor especially in the nasal field, mild improvement 10% -15%.    Assessment  & Plan :   Acute right eye pain along with right eye vision loss - MRI suggestive of right eye optic neuritis along with some questionable demyelinating process in the brain as well, neurology following the patient currently on 5 days of IV steroid regimen, vision minimally better at best, DW Neuro 01/19/21 & 01/20/21 proceed with PLEX, neurology has discussed with patient, I have informed the patient and mother both on 01/20/2021.  Proceed with Plex and continue IV steroids.  2.  Low vitamin D levels and borderline vitamin B12 levels.  Placed on replacement.       Condition - Fair  Family Communication  : Mother Dois Davenport [phone] on 01/17/2021, 01/19/21, 01/20/21  Code Status :  Full  Consults  :  Neuro  PUD Prophylaxis : None   Procedures  :            Disposition Plan  :    Status  is: Inpatient  Remains inpatient appropriate because:IV treatments appropriate due to intensity of illness or inability to take PO  Dispo: The patient is from: Home              Anticipated  d/c is to: Home              Patient currently is not medically stable to d/c.   Difficult to place patient Yes   DVT Prophylaxis  :    enoxaparin (LOVENOX) injection 40 mg Start: 01/17/21 1000    Lab Results  Component Value Date   PLT 223 01/16/2021    Diet :  Diet Order             Diet Heart Room service appropriate? Yes; Fluid consistency: Thin  Diet effective now                    Inpatient Medications  Scheduled Meds:  cholecalciferol  1,000 Units Oral Daily   enoxaparin (LOVENOX) injection  40 mg Subcutaneous Q24H   vitamin B-12  500 mcg Oral Daily   Continuous Infusions:  methylPREDNISolone (SOLU-MEDROL) injection Stopped (01/20/21 0100)   PRN Meds:.acetaminophen **OR** acetaminophen, loratadine, LORazepam  Antibiotics  :    Anti-infectives (From admission, onward)    None        Time Spent in minutes  30   Susa RaringPrashant Hanah Moultry M.D on 01/20/2021 at 9:35 AM  To page go to [email or web address]   [facility] -  Office  [phone]    See all Orders from today for further details    Objective:   Vitals:   01/19/21 1210 01/19/21 1819 01/19/21 2310 01/20/21 0521  BP: 98/64 105/75 105/64 115/72  Pulse: 64 64 68 62  Resp: 16 16 14 16   Temp: 98.3 F (36.8 C) 98.6 F (37 C) 98.4 F (36.9 C) 97.7 F (36.5 C)  TempSrc: Oral Oral Oral Oral  SpO2: 98% 98% 95% 99%  Weight:      Height:        Wt Readings from Last 3 Encounters:  01/16/21 57.2 kg     Intake/Output Summary (Last 24 hours) at 01/20/2021 0935 Last data filed at 01/20/2021 0100 Gross per 24 hour  Intake 60.1 ml  Output --  Net 60.1 ml     Physical Exam  Awake Alert, No new F.N deficits, Normal affect, R Nasal hemianopia [place].AT,PERRAL Supple Neck,No JVD, No cervical lymphadenopathy  appriciated.  Symmetrical Chest wall movement, Good air movement bilaterally, CTAB RRR,No Gallops, Rubs or new Murmurs, No Parasternal Heave +ve B.Sounds, Abd Soft, No tenderness, No organomegaly appriciated, No rebound - guarding or rigidity. No Cyanosis, Clubbing or edema, No new Rash or bruise    Data Review:    CBC Recent Labs  Lab 01/16/21 2142  WBC 4.4  HGB 12.6  HCT 41.2  PLT 223  MCV 84.1  MCH 25.7*  MCHC 30.6  RDW 13.2  LYMPHSABS 2.0  MONOABS 0.3  EOSABS 0.1  BASOSABS 0.0    Recent Labs  Lab 01/16/21 2142 01/18/21 0730  NA 138 136  K 3.7 3.9  CL 104 102  CO2 26 24  GLUCOSE 97 135*  BUN 8 14  CREATININE 1.10* 1.04*  CALCIUM 9.7 9.5  AST 28  --   ALT 21  --   ALKPHOS 30*  --   BILITOT 1.1  --   ALBUMIN 4.2  --     ------------------------------------------------------------------------------------------------------------------ No results for input(s): CHOL, HDL, LDLCALC, TRIG, CHOLHDL, LDLDIRECT in the last 72 hours.  No results found for: HGBA1C ------------------------------------------------------------------------------------------------------------------ No results for input(s): TSH, T4TOTAL, T3FREE, THYROIDAB in the last 72 hours.  Invalid input(s): FREET3  Cardiac Enzymes No results for input(s): CKMB, TROPONINI, MYOGLOBIN  in the last 168 hours.  Invalid input(s): CK ------------------------------------------------------------------------------------------------------------------ No results found for: BNP  Micro Results Recent Results (from the past 240 hour(s))  SARS CORONAVIRUS 2 (TAT 6-24 HRS) Nasopharyngeal Nasopharyngeal Swab     Status: None   Collection Time: 01/17/21  3:47 AM   Specimen: Nasopharyngeal Swab  Result Value Ref Range Status   SARS Coronavirus 2 NEGATIVE NEGATIVE Final    Comment: (NOTE) SARS-CoV-2 target nucleic acids are NOT DETECTED.  The SARS-CoV-2 RNA is generally detectable in upper and  lower respiratory specimens during the acute phase of infection. Negative results do not preclude SARS-CoV-2 infection, do not rule out co-infections with other pathogens, and should not be used as the sole basis for treatment or other patient management decisions. Negative results must be combined with clinical observations, patient history, and epidemiological information. The expected result is Negative.  Fact Sheet for Patients: HairSlick.no  Fact Sheet for Healthcare Providers: quierodirigir.com  This test is not yet approved or cleared by the [place] FDA and  has been authorized for detection and/or diagnosis of SARS-CoV-2 by FDA under an Emergency Use Authorization (EUA). This EUA will remain  in effect (meaning this test can be used) for the duration of the COVID-19 declaration under Se ction 564(b)(1) of the Act, 21 U.S.C. section 360bbb-3(b)(1), unless the authorization is terminated or revoked sooner.  Performed at [facility], [address], [place], [place] [address]     Radiology Reports MR BRAIN W CONTRAST  Result Date: 01/17/2021 CLINICAL DATA:  Vision loss, monocular. EXAM: MRI HEAD AND ORBITS WITH CONTRAST TECHNIQUE: Multiplanar, multiecho pulse sequences of the brain and surrounding structures were obtained with intravenous contrast. Multiplanar, multiecho pulse sequences of the orbits and surrounding structures were obtained including fat saturation techniques, after intravenous contrast administration. CONTRAST:  16mL GADAVIST GADOBUTROL 1 MMOL/ML IV SOLN COMPARISON:  Same day MRI head/orbits. FINDINGS: Postcontrast imaging was performed to further evaluate findings on same day MRI due to poor contrast bolus timing on that study. There is asymmetric enhancement of the prechiasmatic, intracanalicular and intraorbital right optic nerve. The optic chiasm and left optic nerves do not appear to enhance.  There also appears to be mild enhancement of the optic nerve sheath in these regions. No masslike enhancement. As mentioned on same day MRI, the right optic nerve is mildly enlarged diffusely relative to the left. Surrounding intra orbital fat appears somewhat fuzzy on the right. Question faint enhancement of a small right periventricular lesion (series 7, image 9). Otherwise, no abnormal enhancement outside of the orbits/optic nerves. IMPRESSION: 1. Postcontrast imaging of the head and orbits was performed to further evaluate finding seen on same day MRI. There is asymmetric enhancement and mild enlargement of the prechiasmatic, intracanalicular, and intraorbital right optic nerve, favored to represent optic neuritis from demyelination given white matter lesions suggestive of demyelination on the same day MRI. Idiopathic perineuritis (pseudotumor) or infectious optic neuritis are thought less likely. 2. Question faint enhancement of a small right periventricular lesion (series 7, image 9), which may represent an area of active demyelination. Otherwise, no abnormal enhancement outside of the orbits/optic nerves. Electronically Signed   By: Feliberto Harts MD   On: 01/17/2021 07:43   MR Brain W and Wo Contrast  Result Date: 01/17/2021 CLINICAL DATA:  Initial evaluation for acute right-sided visual disturbance, suspected optic neuritis. EXAM: MRI HEAD AND ORBITS WITHOUT AND WITH CONTRAST TECHNIQUE: Multiplanar, multiecho pulse sequences of the brain and surrounding structures were obtained  without and with intravenous contrast. Multiplanar, multiecho pulse sequences of the orbits and surrounding structures were obtained including fat saturation techniques, before and after intravenous contrast administration. CONTRAST:  33mL GADAVIST GADOBUTROL 1 MMOL/ML IV SOLN COMPARISON:  None available. FINDINGS: MRI HEAD FINDINGS Brain: Examination is somewhat technically limited as while the patient did receive IV contrast  material for this exam, little to no contrast material is seen within the brain on postcontrast sequences, limiting assessment. Cerebral volume within normal limits. There are scattered patchy T2/FLAIR hyperintensities involving the periventricular and deep white matter of both cerebral hemispheres. A few probable scattered subtle juxtacortical lesions noted as well. Several of these foci radiate from the lateral ventricles in a perpendicular fashion. A few infratentorial lesions involving the left dorsal midbrain and right pons noted (series 12, images 9, 7). For reference purposes, the largest discrete lesion seen at the deep white matter of the right frontal centrum semi ovale and measures 1 cm (series 12, image 15). Multiple corresponding T1 black holes. Findings are most consistent with demyelinating disease/multiple sclerosis. Several of these lesions demonstrate associated T2 shine through, with no definite restricted diffusion to suggest active demyelination. Again, evaluation for associated enhancement is fairly limited on this exam as little to no contrast material is on board for postcontrast sequences. No evidence for acute or subacute infarct. Gray-white matter differentiation maintained. No encephalomalacia to suggest chronic cortical infarction. No foci of susceptibility artifact to suggest acute or chronic intracranial hemorrhage. No mass lesion, midline shift or mass effect. No hydrocephalus or extra-axial fluid collection. Pituitary gland suprasellar region normal. Midline structures intact. Vascular: Major intracranial vascular flow voids are maintained. Skull and upper cervical spine: Craniocervical junction within normal limits. Bone marrow signal intensity normal. No scalp soft tissue abnormality. Other: No mastoid effusion.  Inner ear structures grossly normal. MRI ORBITS FINDINGS Orbits: Globes are symmetric in size with normal morphology bilaterally. While evaluation for possible optic  neuritis is limited given the relative lack of IV contrast on this exam, there is subtle asymmetric enlargement of the right optic nerve as compared to the left. Additionally, the nerve and/or nerve sheath itself appears somewhat fuzzy and irregular as compared to the contralateral left nerve (series 2, image 8). Findings also seen on corresponding coronal sequence (series 5, image 18). Given the patient history and corresponding findings in the brain, findings are highly suspicious for acute optic neuritis. No abnormality seen about the orbital apices. Optic chiasm normally situated within the suprasellar cistern without abnormality. No sellar or suprasellar mass. Extra-ocular muscles symmetric and within normal limits. Lacrimal glands normal. Superior orbital veins symmetric and within normal limits. No abnormality about the cavernous sinus. Visualized sinuses: Visualized paranasal sinuses are clear. Soft tissues: Unremarkable. IMPRESSION: 1. Technically limited exam as while contrast was administered for this study, little to no contrast is seen onboard on postcontrast sequences, somewhat limiting assessment. 2. 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 No definite evidence for active demyelination on this limited study. 3. Subtle asymmetric enlargement and irregularity of the right optic nerve as above, highly suspicious for acute optic neuritis, particularly given the patient history and concomitant findings within the brain. Electronically Signed   By: Rise Mu M.D.   On: 01/17/2021 03:28   DG Chest Port 1 View  Result Date: 01/17/2021 CLINICAL DATA:  Optic neuritis EXAM: PORTABLE CHEST 1 VIEW COMPARISON:  None. FINDINGS: The heart size and mediastinal contours are  within normal limits. No evidence of hilar or mediastinal adenopathy. Both lungs are clear. Right  cervical rib. Artifact from EKG leads. IMPRESSION: Negative chest. Electronically Signed   By: Marnee Spring M.D.   On: 01/17/2021 04:33   MR ORBITS W CONTRAST  Result Date: 01/17/2021 CLINICAL DATA:  Vision loss, monocular. EXAM: MRI HEAD AND ORBITS WITH CONTRAST TECHNIQUE: Multiplanar, multiecho pulse sequences of the brain and surrounding structures were obtained with intravenous contrast. Multiplanar, multiecho pulse sequences of the orbits and surrounding structures were obtained including fat saturation techniques, after intravenous contrast administration. CONTRAST:  63mL GADAVIST GADOBUTROL 1 MMOL/ML IV SOLN COMPARISON:  Same day MRI head/orbits. FINDINGS: Postcontrast imaging was performed to further evaluate findings on same day MRI due to poor contrast bolus timing on that study. There is asymmetric enhancement of the prechiasmatic, intracanalicular and intraorbital right optic nerve. The optic chiasm and left optic nerves do not appear to enhance. There also appears to be mild enhancement of the optic nerve sheath in these regions. No masslike enhancement. As mentioned on same day MRI, the right optic nerve is mildly enlarged diffusely relative to the left. Surrounding intra orbital fat appears somewhat fuzzy on the right. Question faint enhancement of a small right periventricular lesion (series 7, image 9). Otherwise, no abnormal enhancement outside of the orbits/optic nerves. IMPRESSION: 1. Postcontrast imaging of the head and orbits was performed to further evaluate finding seen on same day MRI. There is asymmetric enhancement and mild enlargement of the prechiasmatic, intracanalicular, and intraorbital right optic nerve, favored to represent optic neuritis from demyelination given white matter lesions suggestive of demyelination on the same day MRI. Idiopathic perineuritis (pseudotumor) or infectious optic neuritis are thought less likely. 2. Question faint enhancement of a small right  periventricular lesion (series 7, image 9), which may represent an area of active demyelination. Otherwise, no abnormal enhancement outside of the orbits/optic nerves. Electronically Signed   By: Feliberto Harts MD   On: 01/17/2021 07:43   MR ORBITS W WO CONTRAST  Result Date: 01/17/2021 CLINICAL DATA:  Initial evaluation for acute right-sided visual disturbance, suspected optic neuritis. EXAM: MRI HEAD AND ORBITS WITHOUT AND WITH CONTRAST TECHNIQUE: Multiplanar, multiecho pulse sequences of the brain and surrounding structures were obtained without and with intravenous contrast. Multiplanar, multiecho pulse sequences of the orbits and surrounding structures were obtained including fat saturation techniques, before and after intravenous contrast administration. CONTRAST:  48mL GADAVIST GADOBUTROL 1 MMOL/ML IV SOLN COMPARISON:  None available. FINDINGS: MRI HEAD FINDINGS Brain: Examination is somewhat technically limited as while the patient did receive IV contrast material for this exam, little to no contrast material is seen within the brain on postcontrast sequences, limiting assessment. Cerebral volume within normal limits. There are scattered patchy T2/FLAIR hyperintensities involving the periventricular and deep white matter of both cerebral hemispheres. A few probable scattered subtle juxtacortical lesions noted as well. Several of these foci radiate from the lateral ventricles in a perpendicular fashion. A few infratentorial lesions involving the left dorsal midbrain and right pons noted (series 12, images 9, 7). For reference purposes, the largest discrete lesion seen at the deep white matter of the right frontal centrum semi ovale and measures 1 cm (series 12, image 15). Multiple corresponding T1 black holes. Findings are most consistent with demyelinating disease/multiple sclerosis. Several of these lesions demonstrate associated T2 shine through, with no definite restricted diffusion to suggest active  demyelination. Again, evaluation for associated enhancement is fairly limited on this exam as little to  no contrast material is on board for postcontrast sequences. No evidence for acute or subacute infarct. Gray-white matter differentiation maintained. No encephalomalacia to suggest chronic cortical infarction. No foci of susceptibility artifact to suggest acute or chronic intracranial hemorrhage. No mass lesion, midline shift or mass effect. No hydrocephalus or extra-axial fluid collection. Pituitary gland suprasellar region normal. Midline structures intact. Vascular: Major intracranial vascular flow voids are maintained. Skull and upper cervical spine: Craniocervical junction within normal limits. Bone marrow signal intensity normal. No scalp soft tissue abnormality. Other: No mastoid effusion.  Inner ear structures grossly normal. MRI ORBITS FINDINGS Orbits: Globes are symmetric in size with normal morphology bilaterally. While evaluation for possible optic neuritis is limited given the relative lack of IV contrast on this exam, there is subtle asymmetric enlargement of the right optic nerve as compared to the left. Additionally, the nerve and/or nerve sheath itself appears somewhat fuzzy and irregular as compared to the contralateral left nerve (series 2, image 8). Findings also seen on corresponding coronal sequence (series 5, image 18). Given the patient history and corresponding findings in the brain, findings are highly suspicious for acute optic neuritis. No abnormality seen about the orbital apices. Optic chiasm normally situated within the suprasellar cistern without abnormality. No sellar or suprasellar mass. Extra-ocular muscles symmetric and within normal limits. Lacrimal glands normal. Superior orbital veins symmetric and within normal limits. No abnormality about the cavernous sinus. Visualized sinuses: Visualized paranasal sinuses are clear. Soft tissues: Unremarkable. IMPRESSION: 1. Technically  limited exam as while contrast was administered for this study, little to no contrast is seen onboard on postcontrast sequences, somewhat limiting assessment. 2. 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 No definite evidence for active demyelination on this limited study. 3. Subtle asymmetric enlargement and irregularity of the right optic nerve as above, highly suspicious for acute optic neuritis, particularly given the patient history and concomitant findings within the brain. Electronically Signed   By: Rise Mu M.D.   On: 01/17/2021 03:28

## 2021-01-20 NOTE — Progress Notes (Signed)
   Patient Status: [facility] - In-pt  Assessment and Plan: Optic neuritis with plans for plasma exchange. IR to place temporary dialysis catheter.  ______________________________________________________________________   History of Present Illness: Danielle York is a 30 y.o. female with no significant past medical history. She presented to the ED 01/16/21 with complaints of gradual onset of right eye pain with visual loss. MRI imaging showed asymmetrical right optic nerve thickening concerning for optic neuritis - concern for demyelinating process. Interventional Radiology asked to place temporary dialysis catheter for plasma exchange.   Allergies and medications reviewed.   Review of Systems: A 12 point ROS discussed and pertinent positives are indicated in the HPI above.  All other systems are negative.  Review of Systems  Constitutional:  Negative for appetite change and fatigue.  Eyes:  Positive for visual disturbance.  Respiratory:  Negative for cough and shortness of breath.   Cardiovascular:  Negative for chest pain and leg swelling.  Gastrointestinal:  Negative for diarrhea, nausea and vomiting.   Vital Signs: BP 107/68 (BP Location: Left Arm)   Pulse 65   Temp 97.7 F (36.5 C) (Oral)   Resp 17   Ht 5\' 6"  (1.676 m)   Wt 126 lb (57.2 kg)   LMP 01/11/2021 (Approximate)   SpO2 100%   BMI 20.34 kg/m   Physical Exam Constitutional:      General: She is not in acute distress.    Appearance: She is not ill-appearing.  Pulmonary:     Effort: Pulmonary effort is normal.  Skin:    General: Skin is warm and dry.  Neurological:     Mental Status: She is alert and oriented to person, place, and time.     Imaging reviewed.   Labs:  COAGS: No results for input(s): INR, APTT in the last 8760 hours.  BMP: Recent Labs    01/16/21 2142 01/18/21 0730  NA 138 136  K 3.7 3.9  CL 104 102  CO2 26 24  GLUCOSE 97 135*  BUN 8 14  CALCIUM 9.7 9.5  CREATININE 1.10* 1.04*   GFRNONAA >60 >60       Electronically Signed: 03/20/21, NP 01/20/2021, 3:16 PM   I spent a total of 15 minutes in face to face in clinical consultation, greater than 50% of which was counseling/coordinating care for venous access.

## 2021-01-20 NOTE — Progress Notes (Signed)
Patient seen at the bedside to discuss dialysis catheter placement. Patient requested I come back this afternoon around 2-3 pm when her mother will be at the hospital. Patient stated she would like for me to discuss procedure with her mom.   IR will plan to see patient again this afternoon and tentatively plan for catheter placement 01/21/21.  Alwyn Ren, [place] [phone] 01/20/2021, 10:41 AM

## 2021-01-21 ENCOUNTER — Inpatient Hospital Stay (HOSPITAL_COMMUNITY): Payer: Self-pay

## 2021-01-21 HISTORY — PX: IR US GUIDE VASC ACCESS RIGHT: IMG2390

## 2021-01-21 HISTORY — PX: IR FLUORO GUIDE CV LINE RIGHT: IMG2283

## 2021-01-21 LAB — CBC
HCT: 35.7 % — ABNORMAL LOW (ref 36.0–46.0)
Hemoglobin: 11.4 g/dL — ABNORMAL LOW (ref 12.0–15.0)
MCH: 25.9 pg — ABNORMAL LOW (ref 26.0–34.0)
MCHC: 31.9 g/dL (ref 30.0–36.0)
MCV: 81.1 fL (ref 80.0–100.0)
Platelets: 225 10*3/uL (ref 150–400)
RBC: 4.4 MIL/uL (ref 3.87–5.11)
RDW: 13.2 % (ref 11.5–15.5)
WBC: 9.9 10*3/uL (ref 4.0–10.5)
nRBC: 0 % (ref 0.0–0.2)

## 2021-01-21 LAB — BASIC METABOLIC PANEL
Anion gap: 7 (ref 5–15)
BUN: 11 mg/dL (ref 6–20)
CO2: 27 mmol/L (ref 22–32)
Calcium: 8.9 mg/dL (ref 8.9–10.3)
Chloride: 102 mmol/L (ref 98–111)
Creatinine, Ser: 0.77 mg/dL (ref 0.44–1.00)
GFR, Estimated: >60 mL/min (ref >60)
Glucose, Bld: 125 mg/dL — ABNORMAL HIGH (ref 70–99)
Potassium: 3.8 mmol/L (ref 3.5–5.1)
Sodium: 136 mmol/L (ref 135–145)

## 2021-01-21 IMAGING — US IR TUNNELED CV CATH W/O PORT/PUMP >5YO
2 series · 2 of 2 positions shown · non-contrast
Comparison: none

INDICATION: 29-year-old female referred for temporary plasmapheresis catheter

[Series 1: fl (-) angio · 1 of 1 slices shown]
[im 1/1]
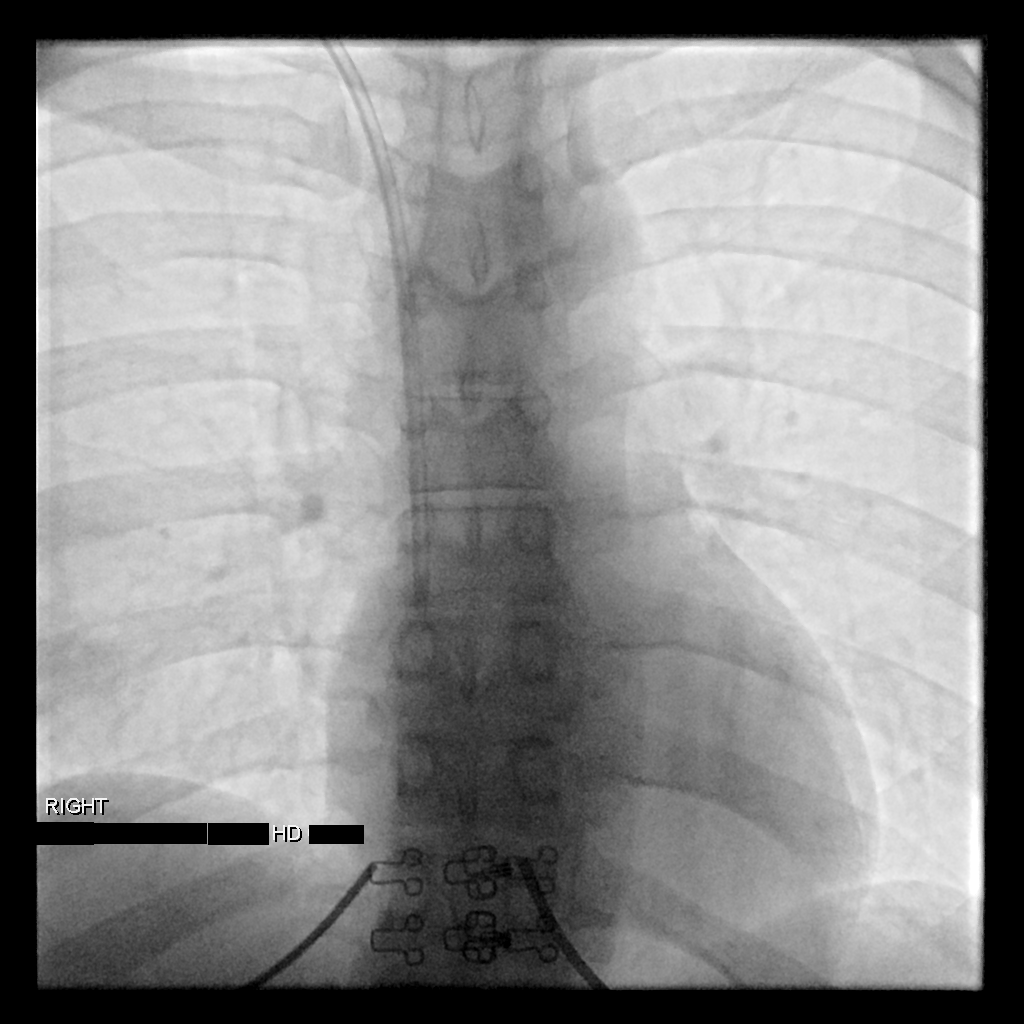

[Series 1: ir tunneled cv cath w/o port/pump >5yo · 1 of 1 slices shown]
[im 1/1]
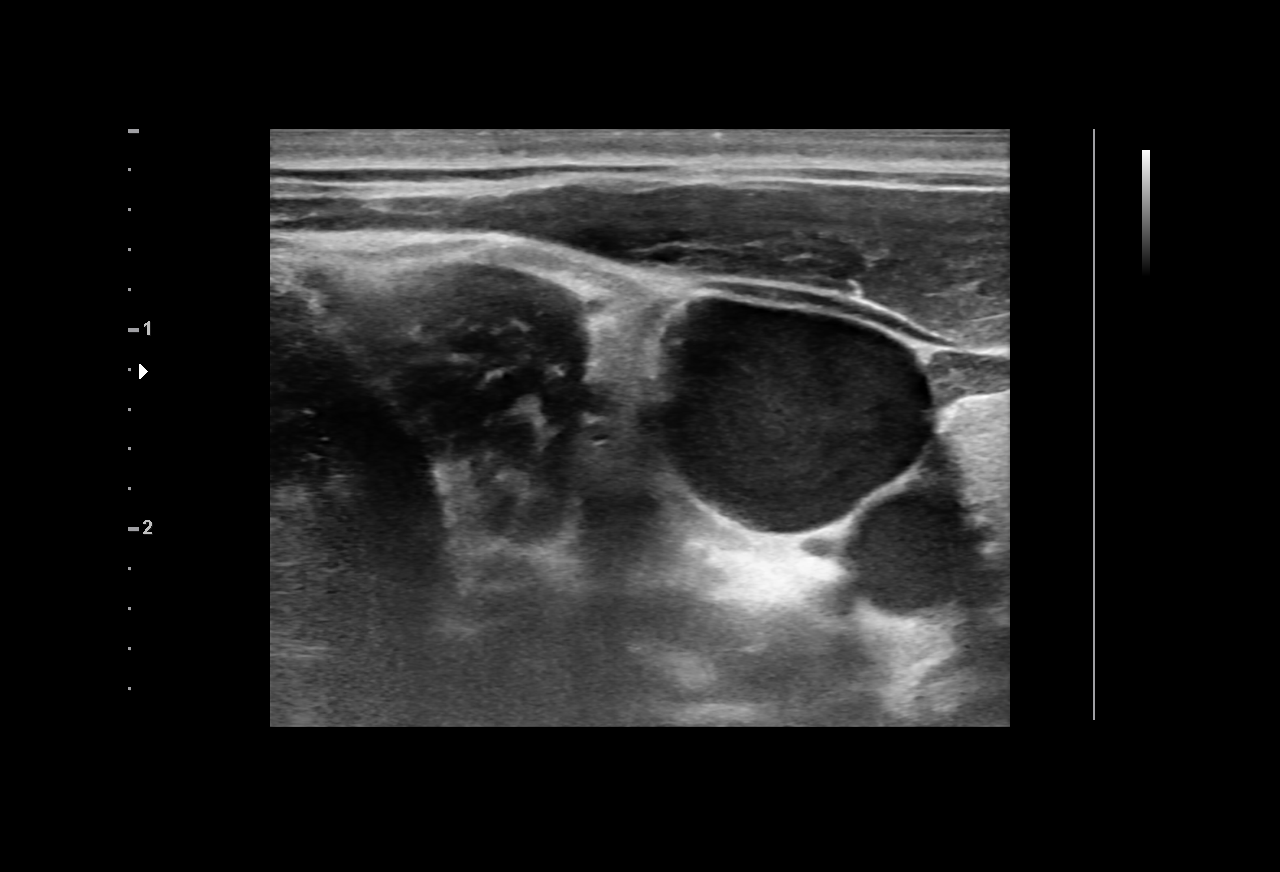

[2 of 2 positions shown; findings below may reference images not displayed]

EXAM:
ULTRASOUND-GUIDED ACCESS RIGHT IJ

TEMPORARY HEMODIALYSIS CATHETER PLACEMENT

MEDICATIONS:
None

ANESTHESIA/SEDATION:
None

FLUOROSCOPY TIME:  Fluoroscopy Time: 0 minutes 12 seconds (3 mGy).

COMPLICATIONS:
None

PROCEDURE:
Informed written consent was obtained from the patient's family
after a discussion of the risks, benefits, and alternatives to
treatment. Questions regarding the procedure were encouraged and
answered. The right neck was prepped with chlorhexidine in a sterile
fashion, and a sterile drape was applied covering the operative
field. Maximum barrier sterile technique with sterile gowns and
gloves were used for the procedure. A timeout was performed prior to
the initiation of the procedure.

A micropuncture kit was utilized to access the right internal
jugular vein under direct, real-time ultrasound guidance after the
overlying soft tissues were anesthetized with 1% lidocaine with
epinephrine. Ultrasound image documentation was performed. The
microwire was kinked to measure appropriate catheter length. A stiff
glidewire was advanced to the level of the IVC. A 15 cm hemodialysis
catheter was then placed over the wire.

Final catheter positioning was confirmed and documented with a spot
radiographic image. The catheter aspirates and flushes normally. The
catheter was flushed with appropriate volume heparin dwells.

Dressings were applied. The patient tolerated the procedure well
without immediate post procedural complication.
IMPRESSION: Status post image guided right IJ temporary hemodialysis catheter
for plasmapheresis.

## 2021-01-21 MED ORDER — CHLORHEXIDINE GLUCONATE CLOTH 2 % EX PADS
6.0000 | MEDICATED_PAD | Freq: Every day | CUTANEOUS | Status: DC
  Administered 2021-01-21 – 2021-01-28 (×5): 6 via TOPICAL

## 2021-01-21 MED ORDER — HEPARIN SODIUM (PORCINE) 1000 UNIT/ML IJ SOLN: 1000.0000 [IU] | Freq: Once | INTRAMUSCULAR | Status: DC

## 2021-01-21 MED ORDER — ACETAMINOPHEN 325 MG PO TABS
ORAL_TABLET | ORAL | Status: AC
  Administered 2021-01-21: 650 mg via ORAL
  Filled 2021-01-21: qty 2

## 2021-01-21 MED ORDER — DIPHENHYDRAMINE HCL 25 MG PO CAPS
ORAL_CAPSULE | ORAL | Status: AC
  Administered 2021-01-21: 25 mg via ORAL
  Filled 2021-01-21: qty 1

## 2021-01-21 MED ORDER — DIPHENHYDRAMINE HCL 25 MG PO CAPS: 25.0000 mg | ORAL_CAPSULE | Freq: Four times a day (QID) | ORAL | Status: DC | PRN

## 2021-01-21 MED ORDER — HEPARIN SODIUM (PORCINE) 1000 UNIT/ML IJ SOLN
INTRAMUSCULAR | Status: AC
  Filled 2021-01-21: qty 1

## 2021-01-21 MED ORDER — CALCIUM GLUCONATE-NACL 2-0.675 GM/100ML-% IV SOLN
2.0000 g | Freq: Once | INTRAVENOUS | Status: AC
  Administered 2021-01-21: 2000 mg via INTRAVENOUS
  Filled 2021-01-21: qty 100

## 2021-01-21 MED ORDER — ACETAMINOPHEN 325 MG PO TABS: 650.0000 mg | ORAL_TABLET | ORAL | Status: DC | PRN

## 2021-01-21 MED ORDER — ACETAMINOPHEN 325 MG PO TABS
650.0000 mg | ORAL_TABLET | ORAL | Status: DC | PRN
  Administered 2021-01-26: 650 mg via ORAL
  Filled 2021-01-21 (×2): qty 2

## 2021-01-21 MED ORDER — SODIUM CHLORIDE 0.9 % IV SOLN
INTRAVENOUS | Status: AC
  Filled 2021-01-21 (×3): qty 200

## 2021-01-21 MED ORDER — ACETAMINOPHEN 650 MG RE SUPP: 650.0000 mg | RECTAL | Status: DC | PRN

## 2021-01-21 MED ORDER — LIDOCAINE HCL 1 % IJ SOLN
INTRAMUSCULAR | Status: AC
  Filled 2021-01-21: qty 20

## 2021-01-21 MED ORDER — CALCIUM CARBONATE ANTACID 500 MG PO CHEW
CHEWABLE_TABLET | ORAL | Status: AC
  Administered 2021-01-21: 400 mg via ORAL
  Filled 2021-01-21: qty 2

## 2021-01-21 MED ORDER — ACD FORMULA A 0.73-2.45-2.2 GM/100ML VI SOLN: 1000.0000 mL | Status: DC

## 2021-01-21 MED ORDER — LIDOCAINE HCL 1 % IJ SOLN
INTRAMUSCULAR | Status: DC | PRN
  Administered 2021-01-21: 10 mL

## 2021-01-21 MED ORDER — CALCIUM CARBONATE ANTACID 500 MG PO CHEW
2.0000 | CHEWABLE_TABLET | ORAL | Status: AC
  Administered 2021-01-21: 400 mg via ORAL

## 2021-01-21 NOTE — Procedures (Signed)
Interventional Radiology Procedure Note  Procedure: Placement of a right IJ approach temp HD cath, 15cm.  Tip is positioned at the superior cavoatrial junction and catheter is ready for immediate use.  Complications: None Recommendations:  - Ok to use - Do not submerge - Routine line care   Signed,  Yvone Neu. Loreta Ave, DO

## 2021-01-21 NOTE — Progress Notes (Signed)
NEUROLOGY CONSULTATION PROGRESS NOTE   Date of service: January 21, 2021 Patient Name: Danielle York MRN:  [id number] DOB:  1991-05-30  Brief HPI  30 yo female who presented with painful vision loss of OD. 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  She has multiple non enhancing T2/FLAIR lesions on MRI along with an enhancement of the R optic nerve. The presence of enhancing and multiple non enhancing lesions simultaneously on MRI provides evidence for dissemination in time and space. 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  She is on IV Solumedrol 1000mg  daily x 5 doses.    Interval Hx   Vision improved by 50%. Had Temp HD Cath placed and will get first session of PLEX today.  Vitals   Vitals:   01/20/21 1740 01/20/21 2332 01/21/21 0414 01/21/21 1232  BP: 114/73 119/75 113/85 117/74  Pulse: 70 61 63 74  Resp: 18 18 18 18   Temp: 98.5 F (36.9 C) 98.4 F (36.9 C) 97.7 F (36.5 C) 98.8 F (37.1 C)  TempSrc: Oral Oral Oral Oral  SpO2: 100% 98% 98% 97%  Weight:      Height:         Body mass index is 20.34 kg/m.  Physical Exam   General: walking around in the room; in no acute distress.  HENT: Normal external appearance of ears and nose.  CV: No JVD. No peripheral edema.  Pulmonary: Symmetric Chest rise. Normal respiratory effort.  Ext: No cyanosis, edema, or deformity  Skin: No rash. Normal palpation of skin.   Neurologic Examination  Mental status/Cognition: Alert, conversant, oriented to self. Speech/language: Fluent, comprehension intact. Cranial nerves:   CN II Pupils equal and reactive to light. Can count fingers, blurred borders, can identify colors but they are dim.   CN III,IV,VI EOM intact, no gaze preference or deviation, no nystagmus   CN V    CN VII no asymmetry, no nasolabial fold flattening   CN VIII    CN IX & X    CN XI 5/5 head turn and 5/5 shoulder shrug bilaterally   CN XII    Motor:   Muscle bulk: normal, tone normal  Coordination/Complex Motor:  - Gait: Stride length normal. Arm swing normal. Base width narrow.  Labs   Basic Metabolic Panel:  Lab Results  Component Value Date   NA 136 01/21/2021   K 3.8 01/21/2021   CO2 27 01/21/2021   GLUCOSE 125 (H) 01/21/2021   BUN 11 01/21/2021   CREATININE 0.77 01/21/2021   CALCIUM 8.9 01/21/2021   GFRNONAA >60 01/21/2021   HbA1c: No results found for: HGBA1C LDL: No results found for: [facility] Urine Drug Screen: No results found for: LABOPIA, COCAINSCRNUR, LABBENZ, AMPHETMU, THCU, LABBARB  Alcohol Level No results found for: ETH No results found for: PHENYTOIN, ZONISAMIDE, LAMOTRIGINE, LEVETIRACETA No results found for: PHENYTOIN, PHENOBARB, VALPROATE, CBMZ  Imaging and Diagnostic studies   MRI Brain and Orbits:   Brain:  -Examination is somewhat technically limited as while the patient did receive IV contrast material for this exam, little to no contrast material is seen within the brain on postcontrast sequences, limiting assessment. -Cerebral volume within normal limits. There are scattered patchy T2/FLAIR hyperintensities involving the periventricular and deep white matter of both cerebral hemispheres. A few probable scattered subtle juxtacortical lesions noted as well. Several of these foci radiate from the lateral ventricles in a perpendicular fashion. A few infratentorial lesions involving the left dorsal midbrain  and right pons noted (series 12, images 9, 7). For reference purposes, the largest discrete lesion seen at the deep white matter of the right frontal centrum semi ovale and measures 1 cm (series 12, image 15). Multiple corresponding T1 black holes. Findings are most consistent with demyelinating disease/multiple sclerosis. Several of these lesions demonstrate associated T2 shine through, with no definite restricted diffusion to suggest active demyelination. Again, evaluation for associated  enhancement is fairly limited on this exam as little to no contrast material is on board for postcontrast sequences. Orbits: -Post contrast imaging of the head and orbits was performed to further evaluate finding seen on same day MRI. There is asymmetric enhancement and mild enlargement of the prechiasmatic, intracanalicular, and intraorbital right optic nerve, favored to represent optic neuritis from demyelination given white matter lesions suggestive of demyelination on the same day MRI. Idiopathic perineuritis (pseudotumor) or infectious optic neuritis are thought less likely. -Question faint enhancement of a small right periventricular lesion (series 7, image 9), which may represent an area of active demyelination. Otherwise, no abnormal enhancement outside of the orbits/optic nerves.   Impression   Hawley LINLEY MOSKAL is a 30 y.o. female presented with painful vision loss of OD. 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 Simultaneous presence of enhancing R optic nerve along with multiple non enhancing lesions on MRI provides evidence for dissemination in time and space. She fits the 2017 McDonald criteria for diagnosis of Multiple Sclerosis.Marland Kitchen Her neurologic examination is notable for R eye central greater than peripheral vision loss.  R eye pain is improved. R eye vision is improved to 50%. Agreeable to PLEX.  Recommendations  - Continue IV solumedrol daily x 5 days, last dose on 8/5. - Temp HD cath placed, PLEX scheduled on 8/5, 8/6, 8/8, 8/10, 8/12. - NMO Panel, ACE, MOG Ab negative. - Follow up with Dr. Despina Arias with [facility] ______________________________________________________________________   Thank you for the opportunity to take part in the care of this patient. If you have any further questions, please contact the neurology consultation attending.  Signed,  Erick Blinks [facility] Pager Number  [phone]

## 2021-01-21 NOTE — Progress Notes (Signed)
PROGRESS NOTE                                                                                                                                                                                                             Patient Demographics:    Danielle York, is a 30 y.o. female, DOB - 08-24-1990, XKP:[id number]  Outpatient Primary MD for the patient is Pcp, No    LOS - 4  Admit date - 01/16/2021    Chief Complaint  Patient presents with   Eye Problem       Brief Narrative (HPI from H&P)  Danielle York is a 30 y.o. 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   Subjective:   Patient in bed, appears comfortable, denies any headache, no fever, no chest pain or pressure, no shortness of breath , no abdominal pain. R eye vision poor especially in the nasal field.   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  2.  Low vitamin D levels and borderline vitamin B12 levels.  Placed on replacement.       Condition - Fair  Family Communication  : Mother Katharine Look [phone] on 01/17/2021, 01/19/21, 01/20/21, father bedside 01/21/21  Code Status :  Full  Consults  :  Neuro, IR  PUD Prophylaxis : None   Procedures  :     R.IJ HD Cath - 01/21/21      Disposition Plan  :    Status is: Inpatient  Remains inpatient appropriate because:IV treatments appropriate due to intensity of  illness or inability to take PO  Dispo: The patient is from: Home              Anticipated d/c is to: Home              Patient  currently is not medically stable to d/c.   Difficult to place patient Yes   DVT Prophylaxis  :    enoxaparin (LOVENOX) injection 40 mg Start: 01/17/21 1000    Lab Results  Component Value Date   PLT 225 01/21/2021    Diet :  Diet Order             Diet Heart Room service appropriate? Yes; Fluid consistency: Thin  Diet effective now                    Inpatient Medications  Scheduled Meds:  cholecalciferol  1,000 Units Oral Daily   enoxaparin (LOVENOX) injection  40 mg Subcutaneous Q24H   heparin sodium (porcine)       lidocaine       vitamin B-12  500 mcg Oral Daily   Continuous Infusions:  methylPREDNISolone (SOLU-MEDROL) injection 1,000 mg (01/21/21 0931)   PRN Meds:.acetaminophen **OR** acetaminophen, lidocaine, loratadine, LORazepam  Antibiotics  :    Anti-infectives (From admission, onward)    None        Time Spent in minutes  30   Lala Lund M.D on 01/21/2021 at 10:26 AM  To page go to [email or web address]   [facility] -  Office  [phone]    See all Orders from today for further details    Objective:   Vitals:   01/20/21 1209 01/20/21 1740 01/20/21 2332 01/21/21 0414  BP: 107/68 114/73 119/75 113/85  Pulse: 65 70 61 63  Resp: 17 18 18 18   Temp: 97.7 F (36.5 C) 98.5 F (36.9 C) 98.4 F (36.9 C) 97.7 F (36.5 C)  TempSrc: Oral Oral Oral Oral  SpO2: 100% 100% 98% 98%  Weight:      Height:        Wt Readings from Last 3 Encounters:  01/16/21 57.2 kg    No intake or output data in the 24 hours ending 01/21/21 1026    Physical Exam  Awake Alert, No new F.N deficits, Normal affect, R Nasal hemianopia [place].AT,PERRAL Supple Neck,No JVD, No cervical lymphadenopathy appriciated.  Symmetrical Chest wall movement, Good air movement bilaterally, CTAB RRR,No Gallops, Rubs or new Murmurs, No  Parasternal Heave +ve B.Sounds, Abd Soft, No tenderness, No organomegaly appriciated, No rebound - guarding or rigidity. No Cyanosis, Clubbing or edema, No new Rash or bruise     Data Review:    CBC Recent Labs  Lab 01/16/21 2142 01/21/21 0510  WBC 4.4 9.9  HGB 12.6 11.4*  HCT 41.2 35.7*  PLT 223 225  MCV 84.1 81.1  MCH 25.7* 25.9*  MCHC 30.6 31.9  RDW 13.2 13.2  LYMPHSABS 2.0  --   MONOABS 0.3  --   EOSABS 0.1  --   BASOSABS 0.0  --     Recent Labs  Lab 01/16/21 2142 01/18/21 0730 01/21/21 0510  NA 138 136 136  K 3.7 3.9 3.8  CL 104 102 102  CO2 26 24 27   GLUCOSE 97 135* 125*  BUN 8 14 11   CREATININE 1.10* 1.04* 0.77  CALCIUM 9.7 9.5 8.9  AST 28  --   --   ALT 21  --   --   ALKPHOS 30*  --   --   BILITOT 1.1  --   --   ALBUMIN 4.2  --   --     ------------------------------------------------------------------------------------------------------------------ No results for input(s): CHOL, HDL, LDLCALC, TRIG, CHOLHDL, LDLDIRECT in the last 72 hours.  No results found for:  HGBA1C ------------------------------------------------------------------------------------------------------------------ No results for input(s): TSH, T4TOTAL, T3FREE, THYROIDAB in the last 72 hours.  Invalid input(s): FREET3  Cardiac Enzymes No results for input(s): CKMB, TROPONINI, MYOGLOBIN in the last 168 hours.  Invalid input(s): CK ------------------------------------------------------------------------------------------------------------------ No results found for: BNP  Micro Results Recent Results (from the past 240 hour(s))  SARS CORONAVIRUS 2 (TAT 6-24 HRS) Nasopharyngeal Nasopharyngeal Swab     Status: None   Collection Time: 01/17/21  3:47 AM   Specimen: Nasopharyngeal Swab  Result Value Ref Range Status   SARS Coronavirus 2 NEGATIVE NEGATIVE Final    Comment: (NOTE) SARS-CoV-2 target nucleic acids are NOT DETECTED.  The SARS-CoV-2 RNA is generally detectable in  upper and lower respiratory specimens during the acute phase of infection. Negative results do not preclude SARS-CoV-2 infection, do not rule out co-infections with other pathogens, and should not be used as the sole basis for treatment or other patient management decisions. Negative results must be combined with clinical observations, patient history, and epidemiological information. The expected result is Negative.  Fact Sheet for Patients: SugarRoll.be  Fact Sheet for Healthcare Providers: https://www.woods-mathews.com/  This test is not yet approved or cleared by the [place] FDA and  has been authorized for detection and/or diagnosis of SARS-CoV-2 by FDA under an Emergency Use Authorization (EUA). This EUA will remain  in effect (meaning this test can be used) for the duration of the COVID-19 declaration under Se ction 564(b)(1) of the Act, 21 U.S.C. section 360bbb-3(b)(1), unless the authorization is terminated or revoked sooner.  Performed at [facility], [place] [address] [place], [place] [address]     Radiology Reports MR BRAIN W CONTRAST  Result Date: 01/17/2021 CLINICAL DATA:  Vision loss, monocular. EXAM: MRI HEAD AND ORBITS WITH CONTRAST TECHNIQUE: Multiplanar, multiecho pulse sequences of the brain and surrounding structures were obtained with intravenous contrast. Multiplanar, multiecho pulse sequences of the orbits and surrounding structures were obtained including fat saturation techniques, after intravenous contrast administration. CONTRAST:  39m GADAVIST GADOBUTROL 1 MMOL/ML IV SOLN COMPARISON:  Same day MRI head/orbits. FINDINGS: Postcontrast imaging was performed to further evaluate findings on same day MRI due to poor contrast bolus timing on that study. There is asymmetric enhancement of the prechiasmatic, intracanalicular and intraorbital right optic nerve. The optic chiasm and left optic nerves do not appear to  enhance. There also appears to be mild enhancement of the optic nerve sheath in these regions. No masslike enhancement. As mentioned on same day MRI, the right optic nerve is mildly enlarged diffusely relative to the left. Surrounding intra orbital fat appears somewhat fuzzy on the right. Question faint enhancement of a small right periventricular lesion (series 7, image 9). Otherwise, no abnormal enhancement outside of the orbits/optic nerves. IMPRESSION: 1. Postcontrast imaging of the head and orbits was performed to further evaluate finding seen on same day MRI. There is asymmetric enhancement and mild enlargement of the prechiasmatic, intracanalicular, and intraorbital right optic nerve, favored to represent optic neuritis from demyelination given white matter lesions suggestive of demyelination on the same day MRI. Idiopathic perineuritis (pseudotumor) or infectious optic neuritis are thought less likely. 2. Question faint enhancement of a small right periventricular lesion (series 7, image 9), which may represent an area of active demyelination. Otherwise, no abnormal enhancement outside of the orbits/optic nerves. Electronically Signed   By: FMargaretha SheffieldMD   On: 01/17/2021 07:43   MR Brain W and Wo Contrast  Result Date: 01/17/2021 CLINICAL DATA:  Initial evaluation for  acute right-sided visual disturbance, suspected optic neuritis. EXAM: MRI HEAD AND ORBITS WITHOUT AND WITH CONTRAST TECHNIQUE: Multiplanar, multiecho pulse sequences of the brain and surrounding structures were obtained without and with intravenous contrast. Multiplanar, multiecho pulse sequences of the orbits and surrounding structures were obtained including fat saturation techniques, before and after intravenous contrast administration. CONTRAST:  23m GADAVIST GADOBUTROL 1 MMOL/ML IV SOLN COMPARISON:  None available. FINDINGS: MRI HEAD FINDINGS Brain: Examination is somewhat technically limited as while the patient did receive IV  contrast material for this exam, little to no contrast material is seen within the brain on postcontrast sequences, limiting assessment. Cerebral volume within normal limits. There are scattered patchy T2/FLAIR hyperintensities involving the periventricular and deep white matter of both cerebral hemispheres. A few probable scattered subtle juxtacortical lesions noted as well. Several of these foci radiate from the lateral ventricles in a perpendicular fashion. A few infratentorial lesions involving the left dorsal midbrain and right pons noted (series 12, images 9, 7). For reference purposes, the largest discrete lesion seen at the deep white matter of the right frontal centrum semi ovale and measures 1 cm (series 12, image 15). Multiple corresponding T1 black holes. Findings are most consistent with demyelinating disease/multiple sclerosis. Several of these lesions demonstrate associated T2 shine through, with no definite restricted diffusion to suggest active demyelination. Again, evaluation for associated enhancement is fairly limited on this exam as little to no contrast material is on board for postcontrast sequences. No evidence for acute or subacute infarct. Gray-white matter differentiation maintained. No encephalomalacia to suggest chronic cortical infarction. No foci of susceptibility artifact to suggest acute or chronic intracranial hemorrhage. No mass lesion, midline shift or mass effect. No hydrocephalus or extra-axial fluid collection. Pituitary gland suprasellar region normal. Midline structures intact. Vascular: Major intracranial vascular flow voids are maintained. Skull and upper cervical spine: Craniocervical junction within normal limits. Bone marrow signal intensity normal. No scalp soft tissue abnormality. Other: No mastoid effusion.  Inner ear structures grossly normal. MRI ORBITS FINDINGS Orbits: Globes are symmetric in size with normal morphology bilaterally. While evaluation for possible  optic neuritis is limited given the relative lack of IV contrast on this exam, there is subtle asymmetric enlargement of the right optic nerve as compared to the left. Additionally, the nerve and/or nerve sheath itself appears somewhat fuzzy and irregular as compared to the contralateral left nerve (series 2, image 8). Findings also seen on corresponding coronal sequence (series 5, image 18). Given the patient history and corresponding findings in the brain, findings are highly suspicious for acute optic neuritis. No abnormality seen about the orbital apices. Optic chiasm normally situated within the suprasellar cistern without abnormality. No sellar or suprasellar mass. Extra-ocular muscles symmetric and within normal limits. Lacrimal glands normal. Superior orbital veins symmetric and within normal limits. No abnormality about the cavernous sinus. Visualized sinuses: Visualized paranasal sinuses are clear. Soft tissues: Unremarkable. IMPRESSION: 1. Technically limited exam as while contrast was administered for this study, little to no contrast is seen onboard on postcontrast sequences, somewhat limiting assessment. 2. 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 No definite evidence for active demyelination on this limited study. 3. Subtle asymmetric enlargement and irregularity of the right optic nerve as above, highly suspicious for acute optic neuritis, particularly given the patient history and concomitant findings within the brain. Electronically Signed   By: BJeannine BogaM.D.   On: 01/17/2021 03:28   IR  US Guide Vasc Access Right  Result Date: 01/21/2021 INDICATION: 30 year old female referred for temporary plasmapheresis catheter EXAM: ULTRASOUND-GUIDED ACCESS RIGHT IJ TEMPORARY HEMODIALYSIS CATHETER PLACEMENT MEDICATIONS: None ANESTHESIA/SEDATION: None FLUOROSCOPY  TIME:  Fluoroscopy Time: 0 minutes 12 seconds (3 mGy). COMPLICATIONS: None PROCEDURE: Informed written consent was obtained from the patient's family after a discussion of the risks, benefits, and alternatives to treatment. Questions regarding the procedure were encouraged and answered. The right neck was prepped with chlorhexidine in a sterile fashion, and a sterile drape was applied covering the operative field. Maximum barrier sterile technique with sterile gowns and gloves were used for the procedure. A timeout was performed prior to the initiation of the procedure. A micropuncture kit was utilized to access the right internal jugular vein under direct, real-time ultrasound guidance after the overlying soft tissues were anesthetized with 1% lidocaine with epinephrine. Ultrasound image documentation was performed. The microwire was kinked to measure appropriate catheter length. A stiff glidewire was advanced to the level of the IVC. A 15 cm hemodialysis catheter was then placed over the wire. Final catheter positioning was confirmed and documented with a spot radiographic image. The catheter aspirates and flushes normally. The catheter was flushed with appropriate volume heparin dwells. Dressings were applied. The patient tolerated the procedure well without immediate post procedural complication. IMPRESSION: Status post image guided right IJ temporary hemodialysis catheter for plasmapheresis. Signed, Dulcy Fanny. Dellia Nims, RPVI [facility] Electronically Signed   By: Corrie Mckusick D.O.   On: 01/21/2021 09:19   DG Chest Port 1 View  Result Date: 01/17/2021 CLINICAL DATA:  Optic neuritis EXAM: PORTABLE CHEST 1 VIEW COMPARISON:  None. FINDINGS: The heart size and mediastinal contours are within normal limits. No evidence of hilar or mediastinal adenopathy. Both lungs are clear. Right cervical rib. Artifact from EKG leads. IMPRESSION: Negative chest.  Electronically Signed   By: Monte Fantasia M.D.   On: 01/17/2021 04:33   IR TUNNELED CENTRAL VENOUS CATHETER PLACEMENT  Result Date: 01/21/2021 INDICATION: 30 year old female referred for temporary plasmapheresis catheter EXAM: ULTRASOUND-GUIDED ACCESS RIGHT IJ TEMPORARY HEMODIALYSIS CATHETER PLACEMENT MEDICATIONS: None ANESTHESIA/SEDATION: None FLUOROSCOPY TIME:  Fluoroscopy Time: 0 minutes 12 seconds (3 mGy). COMPLICATIONS: None PROCEDURE: Informed written consent was obtained from the patient's family after a discussion of the risks, benefits, and alternatives to treatment. Questions regarding the procedure were encouraged and answered. The right neck was prepped with chlorhexidine in a sterile fashion, and a sterile drape was applied covering the operative field. Maximum barrier sterile technique with sterile gowns and gloves were used for the procedure. A timeout was performed prior to the initiation of the procedure. A micropuncture kit was utilized to access the right internal jugular vein under direct, real-time ultrasound guidance after the overlying soft tissues were anesthetized with 1% lidocaine with epinephrine. Ultrasound image documentation was performed. The microwire was kinked to measure appropriate catheter length. A stiff glidewire was advanced to the level of the IVC. A 15 cm hemodialysis catheter was then placed over the wire. Final catheter positioning was confirmed and documented with a spot radiographic image. The catheter aspirates and flushes normally. The catheter was flushed with appropriate volume heparin dwells. Dressings were applied. The patient tolerated the procedure well without immediate post procedural complication. IMPRESSION: Status post image guided right IJ temporary hemodialysis catheter for plasmapheresis. Signed, Dulcy Fanny. Dellia Nims, RPVI [facility] Radiology Electronically Signed   By: Corrie Mckusick D.O.   On:  01/21/2021 09:19   MR ORBITS W CONTRAST  Result Date: 01/17/2021 CLINICAL DATA:  Vision loss, monocular. EXAM: MRI HEAD AND ORBITS WITH CONTRAST TECHNIQUE: Multiplanar, multiecho pulse sequences of the brain and surrounding structures were obtained with intravenous contrast. Multiplanar, multiecho pulse sequences of the orbits and surrounding structures were obtained including fat saturation techniques, after intravenous contrast administration. CONTRAST:  29m GADAVIST GADOBUTROL 1 MMOL/ML IV SOLN COMPARISON:  Same day MRI head/orbits. FINDINGS: Postcontrast imaging was performed to further evaluate findings on same day MRI due to poor contrast bolus timing on that study. There is asymmetric enhancement of the prechiasmatic, intracanalicular and intraorbital right optic nerve. The optic chiasm and left optic nerves do not appear to enhance. There also appears to be mild enhancement of the optic nerve sheath in these regions. No masslike enhancement. As mentioned on same day MRI, the right optic nerve is mildly enlarged diffusely relative to the left. Surrounding intra orbital fat appears somewhat fuzzy on the right. Question faint enhancement of a small right periventricular lesion (series 7, image 9). Otherwise, no abnormal enhancement outside of the orbits/optic nerves. IMPRESSION: 1. Postcontrast imaging of the head and orbits was performed to further evaluate finding seen on same day MRI. There is asymmetric enhancement and mild enlargement of the prechiasmatic, intracanalicular, and intraorbital right optic nerve, favored to represent optic neuritis from demyelination given white matter lesions suggestive of demyelination on the same day MRI. Idiopathic perineuritis (pseudotumor) or infectious optic neuritis are thought less likely. 2. Question faint enhancement of a small right periventricular lesion (series 7, image 9), which may represent an area of active demyelination. Otherwise, no abnormal enhancement  outside of the orbits/optic nerves. Electronically Signed   By: FMargaretha SheffieldMD   On: 01/17/2021 07:43   MR ORBITS W WO CONTRAST  Result Date: 01/17/2021 CLINICAL DATA:  Initial evaluation for acute right-sided visual disturbance, suspected optic neuritis. EXAM: MRI HEAD AND ORBITS WITHOUT AND WITH CONTRAST TECHNIQUE: Multiplanar, multiecho pulse sequences of the brain and surrounding structures were obtained without and with intravenous contrast. Multiplanar, multiecho pulse sequences of the orbits and surrounding structures were obtained including fat saturation techniques, before and after intravenous contrast administration. CONTRAST:  525mGADAVIST GADOBUTROL 1 MMOL/ML IV SOLN COMPARISON:  None available. FINDINGS: MRI HEAD FINDINGS Brain: Examination is somewhat technically limited as while the patient did receive IV contrast material for this exam, little to no contrast material is seen within the brain on postcontrast sequences, limiting assessment. Cerebral volume within normal limits. There are scattered patchy T2/FLAIR hyperintensities involving the periventricular and deep white matter of both cerebral hemispheres. A few probable scattered subtle juxtacortical lesions noted as well. Several of these foci radiate from the lateral ventricles in a perpendicular fashion. A few infratentorial lesions involving the left dorsal midbrain and right pons noted (series 12, images 9, 7). For reference purposes, the largest discrete lesion seen at the deep white matter of the right frontal centrum semi ovale and measures 1 cm (series 12, image 15). Multiple corresponding T1 black holes. Findings are most consistent with demyelinating disease/multiple sclerosis. Several of these lesions demonstrate associated T2 shine through, with no definite restricted diffusion to suggest active demyelination. Again, evaluation for associated enhancement is fairly limited on this exam as little to no contrast material is on  board for postcontrast sequences. No evidence for acute or subacute infarct. Gray-white matter differentiation maintained. No encephalomalacia to suggest chronic cortical infarction. No foci of susceptibility artifact to suggest acute or chronic  intracranial hemorrhage. No mass lesion, midline shift or mass effect. No hydrocephalus or extra-axial fluid collection. Pituitary gland suprasellar region normal. Midline structures intact. Vascular: Major intracranial vascular flow voids are maintained. Skull and upper cervical spine: Craniocervical junction within normal limits. Bone marrow signal intensity normal. No scalp soft tissue abnormality. Other: No mastoid effusion.  Inner ear structures grossly normal. MRI ORBITS FINDINGS Orbits: Globes are symmetric in size with normal morphology bilaterally. While evaluation for possible optic neuritis is limited given the relative lack of IV contrast on this exam, there is subtle asymmetric enlargement of the right optic nerve as compared to the left. Additionally, the nerve and/or nerve sheath itself appears somewhat fuzzy and irregular as compared to the contralateral left nerve (series 2, image 8). Findings also seen on corresponding coronal sequence (series 5, image 18). Given the patient history and corresponding findings in the brain, findings are highly suspicious for acute optic neuritis. No abnormality seen about the orbital apices. Optic chiasm normally situated within the suprasellar cistern without abnormality. No sellar or suprasellar mass. Extra-ocular muscles symmetric and within normal limits. Lacrimal glands normal. Superior orbital veins symmetric and within normal limits. No abnormality about the cavernous sinus. Visualized sinuses: Visualized paranasal sinuses are clear. Soft tissues: Unremarkable. IMPRESSION: 1. Technically limited exam as while contrast was administered for this study, little to no contrast is seen onboard on postcontrast sequences,  somewhat limiting assessment. 2. 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 No definite evidence for active demyelination on this limited study. 3. Subtle asymmetric enlargement and irregularity of the right optic nerve as above, highly suspicious for acute optic neuritis, particularly given the patient history and concomitant findings within the brain. Electronically Signed   By: Jeannine Boga M.D.   On: 01/17/2021 03:28

## 2021-01-21 NOTE — Progress Notes (Signed)
NEUROLOGY CONSULTATION PROGRESS NOTE   Date of service: January 21, 2021 Patient Name: Danielle York MRN:  [id number] DOB:  16-Sep-1990  Brief HPI  30 yo female who presented with painful vision loss of OD. 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  She has multiple non enhancing T2/FLAIR lesions on MRI along with an enhancement of the R optic nerve. The presence of enhancing and multiple non enhancing lesions simultaneously on MRI provides evidence for dissemination in time and space. 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  She is on IV Solumedrol 1000mg  daily x 5 doses. She took her 3rd dose this AM. Pain is improved but minimal improvement in her vision.   Interval Hx   Vision improved by 15%, hesitant about PLEX but discussed with patient and her Step mom and the bedside and they agree to it.  Vitals   Vitals:   01/20/21 1209 01/20/21 1740 01/20/21 2332 01/21/21 0414  BP: 107/68 114/73 119/75 113/85  Pulse: 65 70 61 63  Resp: 17 18 18 18   Temp: 97.7 F (36.5 C) 98.5 F (36.9 C) 98.4 F (36.9 C) 97.7 F (36.5 C)  TempSrc: Oral Oral Oral Oral  SpO2: 100% 100% 98% 98%  Weight:      Height:         Body mass index is 20.34 kg/m.  Physical Exam   General: walking around in the room; in no acute distress.  HENT: Normal external appearance of ears and nose.  CV: No JVD. No peripheral edema.  Pulmonary: Symmetric Chest rise. Normal respiratory effort.  Ext: No cyanosis, edema, or deformity  Skin: No rash. Normal palpation of skin.   Neurologic Examination  Mental status/Cognition: Alert, conversant, oriented to self. Speech/language: Fluent, comprehension intact. Cranial nerves:   CN II Pupils equal and reactive to light, R APD. Can make out the outlines of objects with central vision but not much else.   CN III,IV,VI EOM intact, no gaze preference or deviation, no nystagmus   CN V    CN VII no asymmetry, no  nasolabial fold flattening   CN VIII    CN IX & X    CN XI 5/5 head turn and 5/5 shoulder shrug bilaterally   CN XII    Motor:  Muscle bulk: normal, tone normal  Coordination/Complex Motor:  - Gait: Stride length normal. Arm swing normal. Base width narrow.  Labs   Basic Metabolic Panel:  Lab Results  Component Value Date   NA 136 01/21/2021   K 3.8 01/21/2021   CO2 27 01/21/2021   GLUCOSE 125 (H) 01/21/2021   BUN 11 01/21/2021   CREATININE 0.77 01/21/2021   CALCIUM 8.9 01/21/2021   GFRNONAA >60 01/21/2021   HbA1c: No results found for: HGBA1C LDL: No results found for: [facility] Urine Drug Screen: No results found for: LABOPIA, COCAINSCRNUR, LABBENZ, AMPHETMU, THCU, LABBARB  Alcohol Level No results found for: ETH No results found for: PHENYTOIN, ZONISAMIDE, LAMOTRIGINE, LEVETIRACETA No results found for: PHENYTOIN, PHENOBARB, VALPROATE, CBMZ  Imaging and Diagnostic studies   MRI Brain and Orbits:   Brain:  -Examination is somewhat technically limited as while the patient did receive IV contrast material for this exam, little to no contrast material is seen within the brain on postcontrast sequences, limiting assessment. -Cerebral volume within normal limits. There are scattered patchy T2/FLAIR hyperintensities involving the periventricular and deep white matter of both cerebral hemispheres. A few probable scattered subtle juxtacortical lesions  noted as well. Several of these foci radiate from the lateral ventricles in a perpendicular fashion. A few infratentorial lesions involving the left dorsal midbrain and right pons noted (series 12, images 9, 7). For reference purposes, the largest discrete lesion seen at the deep white matter of the right frontal centrum semi ovale and measures 1 cm (series 12, image 15). Multiple corresponding T1 black holes. Findings are most consistent with demyelinating disease/multiple sclerosis. Several of these lesions demonstrate  associated T2 shine through, with no definite restricted diffusion to suggest active demyelination. Again, evaluation for associated enhancement is fairly limited on this exam as little to no contrast material is on board for postcontrast sequences. Orbits: -Post contrast imaging of the head and orbits was performed to further evaluate finding seen on same day MRI. There is asymmetric enhancement and mild enlargement of the prechiasmatic, intracanalicular, and intraorbital right optic nerve, favored to represent optic neuritis from demyelination given white matter lesions suggestive of demyelination on the same day MRI. Idiopathic perineuritis (pseudotumor) or infectious optic neuritis are thought less likely. -Question faint enhancement of a small right periventricular lesion (series 7, image 9), which may represent an area of active demyelination. Otherwise, no abnormal enhancement outside of the orbits/optic nerves.   Impression   Danielle York is a 30 y.o. female presented with painful vision loss of OD. 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 Simultaneous presence of enhancing R optic nerve along with multiple non enhancing lesions on MRI provides evidence for dissemination in time and space. She fits the 2017 McDonald criteria for diagnosis of Multiple Sclerosis.Marland Kitchen Her neurologic examination is notable for R eye central greater than peripheral vision loss.  R eye pain is improved. R eye vision is improved by 15% only. Initially hesitant to PLEX and some back and forth between deciding for PLEX but eventually agreed to it.  Recommendations  - Continue IV solumedrol daily x 5 days, last dose on 8/5. - discussed with patient and her mom regarding PLEX and she opted to go with further treatment with plasma exchange at this time. I will place the appropriate PLEX orders. - IR to place HD  cath. ______________________________________________________________________   Thank you for the opportunity to take part in the care of this patient. If you have any further questions, please contact the neurology consultation attending.  Signed,  Erick Blinks [facility] Pager Number [phone]

## 2021-01-22 LAB — BASIC METABOLIC PANEL
Anion gap: 4 — ABNORMAL LOW (ref 5–15)
BUN: 13 mg/dL (ref 6–20)
CO2: 26 mmol/L (ref 22–32)
Calcium: 8.6 mg/dL — ABNORMAL LOW (ref 8.9–10.3)
Chloride: 108 mmol/L (ref 98–111)
Creatinine, Ser: 0.99 mg/dL (ref 0.44–1.00)
GFR, Estimated: >60 mL/min (ref >60)
Glucose, Bld: 103 mg/dL — ABNORMAL HIGH (ref 70–99)
Potassium: 3.9 mmol/L (ref 3.5–5.1)
Sodium: 138 mmol/L (ref 135–145)

## 2021-01-22 MED ORDER — ALBUMIN HUMAN 25 % IV SOLN
Freq: Once | INTRAVENOUS | Status: DC
  Filled 2021-01-22: qty 200

## 2021-01-22 MED ORDER — CALCIUM GLUCONATE-NACL 2-0.675 GM/100ML-% IV SOLN: 2.0000 g | Freq: Once | INTRAVENOUS | Status: DC

## 2021-01-22 MED ORDER — ACETAMINOPHEN 325 MG PO TABS: 650.0000 mg | ORAL_TABLET | ORAL | Status: DC | PRN

## 2021-01-22 MED ORDER — DIPHENHYDRAMINE HCL 25 MG PO CAPS
ORAL_CAPSULE | ORAL | Status: AC
  Administered 2021-01-22: 25 mg via ORAL
  Filled 2021-01-22: qty 1

## 2021-01-22 MED ORDER — ACD FORMULA A 0.73-2.45-2.2 GM/100ML VI SOLN
1000.0000 mL | Status: DC
  Administered 2021-01-22: 1000 mL
  Filled 2021-01-22: qty 1000

## 2021-01-22 MED ORDER — CALCIUM CARBONATE ANTACID 500 MG PO CHEW
2.0000 | CHEWABLE_TABLET | ORAL | Status: AC
  Administered 2021-01-22: 400 mg via ORAL
  Filled 2021-01-22: qty 2

## 2021-01-22 MED ORDER — HEPARIN SODIUM (PORCINE) 1000 UNIT/ML IJ SOLN: 1000.0000 [IU] | Freq: Once | INTRAMUSCULAR | Status: DC

## 2021-01-22 MED ORDER — DIPHENHYDRAMINE HCL 25 MG PO CAPS: 25.0000 mg | ORAL_CAPSULE | Freq: Four times a day (QID) | ORAL | Status: DC | PRN

## 2021-01-22 MED ORDER — SODIUM CHLORIDE 0.9 % IV SOLN
INTRAVENOUS | Status: AC
  Filled 2021-01-22 (×3): qty 200

## 2021-01-22 MED ORDER — CALCIUM CARBONATE ANTACID 500 MG PO CHEW
2.0000 | CHEWABLE_TABLET | ORAL | Status: DC
Start: 2021-01-22 — End: 2021-01-22

## 2021-01-22 MED ORDER — ACD FORMULA A 0.73-2.45-2.2 GM/100ML VI SOLN
1000.0000 mL | Status: DC
  Filled 2021-01-22: qty 1000

## 2021-01-22 MED ORDER — CALCIUM CARBONATE ANTACID 500 MG PO CHEW: 2.0000 | CHEWABLE_TABLET | ORAL | Status: DC

## 2021-01-22 MED ORDER — CALCIUM CARBONATE ANTACID 500 MG PO CHEW
2.0000 | CHEWABLE_TABLET | ORAL | Status: AC
  Filled 2021-01-22 (×2): qty 2

## 2021-01-22 MED ORDER — CALCIUM CARBONATE ANTACID 500 MG PO CHEW
CHEWABLE_TABLET | ORAL | Status: AC
  Administered 2021-01-22: 400 mg
  Filled 2021-01-22: qty 2

## 2021-01-22 MED ORDER — HEPARIN SODIUM (PORCINE) 1000 UNIT/ML IJ SOLN
1000.0000 [IU] | Freq: Once | INTRAMUSCULAR | Status: DC
  Filled 2021-01-22: qty 1

## 2021-01-22 MED ORDER — CALCIUM GLUCONATE-NACL 2-0.675 GM/100ML-% IV SOLN
2.0000 g | Freq: Once | INTRAVENOUS | Status: AC
  Administered 2021-01-22: 2000 mg via INTRAVENOUS
  Filled 2021-01-22: qty 100

## 2021-01-22 MED ORDER — ACETAMINOPHEN 325 MG PO TABS
ORAL_TABLET | ORAL | Status: AC
  Administered 2021-01-22: 650 mg via ORAL
  Filled 2021-01-22: qty 2

## 2021-01-22 NOTE — Progress Notes (Signed)
NEUROLOGY CONSULTATION PROGRESS NOTE   Date of service: January 22, 2021 Patient Name: Danielle York MRN:  [id number] DOB:  1991/01/21  Brief HPI  30 yo female who presented with painful vision loss of OD. 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  She has multiple non enhancing T2/FLAIR lesions on MRI along with an enhancement of the R optic nerve. The presence of enhancing and multiple non enhancing lesions simultaneously on MRI provides evidence for dissemination in time and space. 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  She is on IV Solumedrol 1000mg  daily x 5 doses.    Interval Hx   Vision improved to 70%, completed 1st session of PLEX with no issues.  Vitals   Vitals:   01/21/21 1652 01/21/21 1727 01/21/21 2318 01/22/21 0517  BP: 113/78 107/75 110/70 115/73  Pulse: 97 81 (!) 55 (!) 57  Resp: 12 20 16 18   Temp:   98.5 F (36.9 C) 98.2 F (36.8 C)  TempSrc:  Oral Oral Oral  SpO2:  98% 98% 99%  Weight:      Height:         Body mass index is 20.34 kg/m.  Physical Exam   General: walking around in the room; in no acute distress.  HENT: Normal external appearance of ears and nose.  CV: No JVD. No peripheral edema.  Pulmonary: Symmetric Chest rise. Normal respiratory effort.  Ext: No cyanosis, edema, or deformity  Skin: No rash. Normal palpation of skin.   Neurologic Examination  Mental status/Cognition: Alert, conversant, oriented to self. Speech/language: Fluent, comprehension intact. Cranial nerves:   CN II Pupils equal and reactive to light. Can count fingers, blurriness in her vision is improving.   CN III,IV,VI EOM intact, no gaze preference or deviation, no nystagmus   CN V    CN VII no asymmetry, no nasolabial fold flattening   CN VIII    CN IX & X    CN XI 5/5 head turn and 5/5 shoulder shrug bilaterally   CN XII    Motor:  Muscle bulk: normal, tone normal  Coordination/Complex Motor:  -  Gait: Stride length normal. Arm swing normal. Base width narrow.  Labs   Basic Metabolic Panel:  Lab Results  Component Value Date   NA 136 01/21/2021   K 3.8 01/21/2021   CO2 27 01/21/2021   GLUCOSE 125 (H) 01/21/2021   BUN 11 01/21/2021   CREATININE 0.77 01/21/2021   CALCIUM 8.9 01/21/2021   GFRNONAA >60 01/21/2021   HbA1c: No results found for: HGBA1C LDL: No results found for: [facility] Urine Drug Screen: No results found for: LABOPIA, COCAINSCRNUR, LABBENZ, AMPHETMU, THCU, LABBARB  Alcohol Level No results found for: ETH No results found for: PHENYTOIN, ZONISAMIDE, LAMOTRIGINE, LEVETIRACETA No results found for: PHENYTOIN, PHENOBARB, VALPROATE, CBMZ  Imaging and Diagnostic studies   MRI Brain and Orbits:   Brain:  -Examination is somewhat technically limited as while the patient did receive IV contrast material for this exam, little to no contrast material is seen within the brain on postcontrast sequences, limiting assessment. -Cerebral volume within normal limits. There are scattered patchy T2/FLAIR hyperintensities involving the periventricular and deep white matter of both cerebral hemispheres. A few probable scattered subtle juxtacortical lesions noted as well. Several of these foci radiate from the lateral ventricles in a perpendicular fashion. A few infratentorial lesions involving the left dorsal midbrain and right pons noted (series 12, images 9, 7). For reference purposes,  the largest discrete lesion seen at the deep white matter of the right frontal centrum semi ovale and measures 1 cm (series 12, image 15). Multiple corresponding T1 black holes. Findings are most consistent with demyelinating disease/multiple sclerosis. Several of these lesions demonstrate associated T2 shine through, with no definite restricted diffusion to suggest active demyelination. Again, evaluation for associated enhancement is fairly limited on this exam as little to no contrast  material is on board for postcontrast sequences. Orbits: -Post contrast imaging of the head and orbits was performed to further evaluate finding seen on same day MRI. There is asymmetric enhancement and mild enlargement of the prechiasmatic, intracanalicular, and intraorbital right optic nerve, favored to represent optic neuritis from demyelination given white matter lesions suggestive of demyelination on the same day MRI. Idiopathic perineuritis (pseudotumor) or infectious optic neuritis are thought less likely. -Question faint enhancement of a small right periventricular lesion (series 7, image 9), which may represent an area of active demyelination. Otherwise, no abnormal enhancement outside of the orbits/optic nerves.   Impression   Danielle York is a 30 y.o. female presented with painful vision loss of OD. 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 Simultaneous presence of enhancing R optic nerve along with multiple non enhancing lesions on MRI provides evidence for dissemination in time and space. She fits the 2017 McDonald criteria for diagnosis of Multiple Sclerosis.Marland Kitchen Her neurologic examination is notable for R eye central greater than peripheral vision loss.  S/p Steroids with some improvement in vision but still significant blurriness, can just make out finger. Unable to read anything with her R eye.  Recommendations  - Continue IV solumedrol daily x 5 days, last dose on 8/5. - PLEX Scheduled on 8/5, 8/6, 8/8, 8/10, 8/12 - tolerated PLEX 1st dose with no issues. - NMO Panel, ACE, MOG Ab negative. - Follow up with Dr. Despina Arias with [facility] ______________________________________________________________________   Thank you for the opportunity to take part in the care of this patient. If you have any further questions, please contact the neurology consultation attending.  Signed,  Erick Blinks [facility] Pager Number [phone]

## 2021-01-22 NOTE — Progress Notes (Signed)
PROGRESS NOTE                                                                                                                                                                                                             Patient Demographics:    Danielle York, is a 30 y.o. female, DOB - [birth year], AQT:[id number]  Outpatient Primary MD for the patient is Pcp, No    LOS - 5  Admit date - 01/16/2021    Chief Complaint  Patient presents with   Eye Problem       Brief Narrative (HPI from H&P)  Danielle York is a 30 y.o. 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   Subjective:   Patient in bed, appears comfortable, denies any headache, no fever, no chest pain or pressure, no shortness of breath , no abdominal pain. No new focal weakness.  Patient has improved after the first Plex treatment.   Assessment  & Plan :   Acute right eye pain along with right eye vision loss - MRI suggestive of right eye optic neuritis along with some questionable demyelinating process in the brain as well, seen by neurology has finished 5 days of IV Solu-Medrol on 01/21/2021, Plex started on 01/21/2021 for 5 runs last run on 01/28/2021, 8/5, 8/6, 8/8, 8/10, 8/12 - per Neuro, right eye vision has improved after Solu-Medrol course and first Plex treatment continue.  2.  Low vitamin D levels and borderline vitamin B12 levels.  Placed on replacement.       Condition - Fair  Family Communication  : Mother Katharine Look [phone] on 01/17/2021, 01/19/21, 01/20/21, father bedside 01/21/21  Code Status :  Full  Consults  :  Neuro, IR  PUD Prophylaxis : None   Procedures  :     R.IJ HD Cath - 01/21/21      Disposition Plan  :    Status is:  Inpatient  Remains inpatient appropriate because:IV treatments appropriate due to intensity of illness or inability to take PO  Dispo: The patient is from: Home              Anticipated d/c is  to: Home              Patient currently is not medically stable to d/c.   Difficult to place patient Yes   DVT Prophylaxis  :    enoxaparin (LOVENOX) injection 40 mg Start: 01/17/21 1000    Lab Results  Component Value Date   PLT 225 01/21/2021    Diet :  Diet Order             Diet Heart Room service appropriate? Yes; Fluid consistency: Thin  Diet effective now                    Inpatient Medications  Scheduled Meds:  Chlorhexidine Gluconate Cloth  6 each Topical Daily   cholecalciferol  1,000 Units Oral Daily   enoxaparin (LOVENOX) injection  40 mg Subcutaneous Q24H   vitamin B-12  500 mcg Oral Daily   Continuous Infusions:   PRN Meds:.acetaminophen **OR** acetaminophen, lidocaine, loratadine, LORazepam  Antibiotics  :    Anti-infectives (From admission, onward)    None        Time Spent in minutes  30   Lala Lund M.D on 01/22/2021 at 11:56 AM  To page go to [email or web address]   [facility] -  Office  [phone]    See all Orders from today for further details    Objective:   Vitals:   01/21/21 1652 01/21/21 1727 01/21/21 2318 01/22/21 0517  BP: 113/78 107/75 110/70 115/73  Pulse: 97 81 (!) 55 (!) 57  Resp: _0 Temp:   98.5 F (36.9 C) 98.2 F (36.8 C)  TempSrc:  Oral Oral Oral  SpO2:  98% 98% 99%  Weight:      Height:        Wt Readings from Last 3 Encounters:  01/16/21 57.2 kg     Intake/Output Summary (Last 24 hours) at 01/22/2021 1156 Last data filed at 01/22/2021 0518 Gross per 24 hour  Intake 308.25 ml  Output --  Net 308.25 ml      Physical Exam  Awake Alert, No new F.N deficits, right eye vision mildly improved [place].AT,PERRAL Supple Neck,No JVD, No cervical lymphadenopathy appriciated.  Symmetrical Chest  wall movement, Good air movement bilaterally, CTAB RRR,No Gallops, Rubs or new Murmurs, No Parasternal Heave +ve B.Sounds, Abd Soft, No tenderness, No organomegaly appriciated, No rebound - guarding or rigidity. No Cyanosis, Clubbing or edema, No new Rash or bruise      Data Review:    CBC Recent Labs  Lab 01/16/21 2142 01/21/21 0510  WBC 4.4 9.9  HGB 12.6 11.4*  HCT 41.2 35.7*  PLT 223 225  MCV 84.1 81.1  MCH 25.7* 25.9*  MCHC 30.6 31.9  RDW 13.2 13.2  LYMPHSABS 2.0  --   MONOABS 0.3  --   EOSABS 0.1  --   BASOSABS 0.0  --     Recent Labs  Lab 01/16/21 2142 01/18/21 0730 01/21/21 0510  NA 138 136 136  K 3.7 3.9 3.8  CL 104 102 102  CO2 _1 GLUCOSE 97 135* 125*  BUN _2 CREATININE 1.10* 1.04* 0.77  CALCIUM 9.7 9.5 8.9  AST 28  --   --   ALT 21  --   --   ALKPHOS 30*  --   --   BILITOT 1.1  --   --   ALBUMIN 4.2  --   --     ------------------------------------------------------------------------------------------------------------------ No  results for input(s): CHOL, HDL, LDLCALC, TRIG, CHOLHDL, LDLDIRECT in the last 72 hours.  No results found for: HGBA1C ------------------------------------------------------------------------------------------------------------------ No results for input(s): TSH, T4TOTAL, T3FREE, THYROIDAB in the last 72 hours.  Invalid input(s): FREET3  Cardiac Enzymes No results for input(s): CKMB, TROPONINI, MYOGLOBIN in the last 168 hours.  Invalid input(s): CK ------------------------------------------------------------------------------------------------------------------ No results found for: BNP  Micro Results Recent Results (from the past 240 hour(s))  SARS CORONAVIRUS 2 (TAT 6-24 HRS) Nasopharyngeal Nasopharyngeal Swab     Status: None   Collection Time: 01/17/21  3:47 AM   Specimen: Nasopharyngeal Swab  Result Value Ref Range Status   SARS Coronavirus 2 NEGATIVE NEGATIVE Final    Comment:  (NOTE) SARS-CoV-2 target nucleic acids are NOT DETECTED.  The SARS-CoV-2 RNA is generally detectable in upper and lower respiratory specimens during the acute phase of infection. Negative results do not preclude SARS-CoV-2 infection, do not rule out co-infections with other pathogens, and should not be used as the sole basis for treatment or other patient management decisions. Negative results must be combined with clinical observations, patient history, and epidemiological information. The expected result is Negative.  Fact Sheet for Patients: SugarRoll.be  Fact Sheet for Healthcare Providers: https://www.woods-mathews.com/  This test is not yet approved or cleared by the [place] FDA and  has been authorized for detection and/or diagnosis of SARS-CoV-2 by FDA under an Emergency Use Authorization (EUA). This EUA will remain  in effect (meaning this test can be used) for the duration of the COVID-19 declaration under Se ction 564(b)(1) of the Act, 21 U.S.C. section 360bbb-3(b)(1), unless the authorization is terminated or revoked sooner.  Performed at [facility], [place] [address], [place], [place] [address]     Radiology Reports MR BRAIN W CONTRAST  Result Date: 01/17/2021 CLINICAL DATA:  Vision loss, monocular. EXAM: MRI HEAD AND ORBITS WITH CONTRAST TECHNIQUE: Multiplanar, multiecho pulse sequences of the brain and surrounding structures were obtained with intravenous contrast. Multiplanar, multiecho pulse sequences of the orbits and surrounding structures were obtained including fat saturation techniques, after intravenous contrast administration. CONTRAST:  62m GADAVIST GADOBUTROL 1 MMOL/ML IV SOLN COMPARISON:  Same day MRI head/orbits. FINDINGS: Postcontrast imaging was performed to further evaluate findings on same day MRI due to poor contrast bolus timing on that study. There is asymmetric enhancement of the prechiasmatic,  intracanalicular and intraorbital right optic nerve. The optic chiasm and left optic nerves do not appear to enhance. There also appears to be mild enhancement of the optic nerve sheath in these regions. No masslike enhancement. As mentioned on same day MRI, the right optic nerve is mildly enlarged diffusely relative to the left. Surrounding intra orbital fat appears somewhat fuzzy on the right. Question faint enhancement of a small right periventricular lesion (series 7, image 9). Otherwise, no abnormal enhancement outside of the orbits/optic nerves. IMPRESSION: 1. Postcontrast imaging of the head and orbits was performed to further evaluate finding seen on same day MRI. There is asymmetric enhancement and mild enlargement of the prechiasmatic, intracanalicular, and intraorbital right optic nerve, favored to represent optic neuritis from demyelination given white matter lesions suggestive of demyelination on the same day MRI. Idiopathic perineuritis (pseudotumor) or infectious optic neuritis are thought less likely. 2. Question faint enhancement of a small right periventricular lesion (series 7, image 9), which may represent an area of active demyelination. Otherwise, no abnormal enhancement outside of the orbits/optic nerves. Electronically Signed   By: FMargaretha SheffieldMD   On: 01/17/2021  07:43   MR Brain W and Wo Contrast  Result Date: 01/17/2021 CLINICAL DATA:  Initial evaluation for acute right-sided visual disturbance, suspected optic neuritis. EXAM: MRI HEAD AND ORBITS WITHOUT AND WITH CONTRAST TECHNIQUE: Multiplanar, multiecho pulse sequences of the brain and surrounding structures were obtained without and with intravenous contrast. Multiplanar, multiecho pulse sequences of the orbits and surrounding structures were obtained including fat saturation techniques, before and after intravenous contrast administration. CONTRAST:  60m GADAVIST GADOBUTROL 1 MMOL/ML IV SOLN COMPARISON:  None available.  FINDINGS: MRI HEAD FINDINGS Brain: Examination is somewhat technically limited as while the patient did receive IV contrast material for this exam, little to no contrast material is seen within the brain on postcontrast sequences, limiting assessment. Cerebral volume within normal limits. There are scattered patchy T2/FLAIR hyperintensities involving the periventricular and deep white matter of both cerebral hemispheres. A few probable scattered subtle juxtacortical lesions noted as well. Several of these foci radiate from the lateral ventricles in a perpendicular fashion. A few infratentorial lesions involving the left dorsal midbrain and right pons noted (series 12, images 9, 7). For reference purposes, the largest discrete lesion seen at the deep white matter of the right frontal centrum semi ovale and measures 1 cm (series 12, image 15). Multiple corresponding T1 black holes. Findings are most consistent with demyelinating disease/multiple sclerosis. Several of these lesions demonstrate associated T2 shine through, with no definite restricted diffusion to suggest active demyelination. Again, evaluation for associated enhancement is fairly limited on this exam as little to no contrast material is on board for postcontrast sequences. No evidence for acute or subacute infarct. Gray-white matter differentiation maintained. No encephalomalacia to suggest chronic cortical infarction. No foci of susceptibility artifact to suggest acute or chronic intracranial hemorrhage. No mass lesion, midline shift or mass effect. No hydrocephalus or extra-axial fluid collection. Pituitary gland suprasellar region normal. Midline structures intact. Vascular: Major intracranial vascular flow voids are maintained. Skull and upper cervical spine: Craniocervical junction within normal limits. Bone marrow signal intensity normal. No scalp soft tissue abnormality. Other: No mastoid effusion.  Inner ear structures grossly normal. MRI ORBITS  FINDINGS Orbits: Globes are symmetric in size with normal morphology bilaterally. While evaluation for possible optic neuritis is limited given the relative lack of IV contrast on this exam, there is subtle asymmetric enlargement of the right optic nerve as compared to the left. Additionally, the nerve and/or nerve sheath itself appears somewhat fuzzy and irregular as compared to the contralateral left nerve (series 2, image 8). Findings also seen on corresponding coronal sequence (series 5, image 18). Given the patient history and corresponding findings in the brain, findings are highly suspicious for acute optic neuritis. No abnormality seen about the orbital apices. Optic chiasm normally situated within the suprasellar cistern without abnormality. No sellar or suprasellar mass. Extra-ocular muscles symmetric and within normal limits. Lacrimal glands normal. Superior orbital veins symmetric and within normal limits. No abnormality about the cavernous sinus. Visualized sinuses: Visualized paranasal sinuses are clear. Soft tissues: Unremarkable. IMPRESSION: 1. Technically limited exam as while contrast was administered for this study, little to no contrast is seen onboard on postcontrast sequences, somewhat limiting assessment. 2. 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 No definite evidence for active demyelination on this limited study. 3. Subtle asymmetric enlargement and irregularity of the right optic nerve as above, highly suspicious for acute optic neuritis, particularly given the patient history and concomitant findings within  the brain. Electronically Signed   By: Jeannine Boga M.D.   On: 01/17/2021 03:28   IR US Guide Vasc Access Right  Result Date: 01/21/2021 INDICATION: 30 year old female referred for temporary plasmapheresis catheter EXAM: ULTRASOUND-GUIDED  ACCESS RIGHT IJ TEMPORARY HEMODIALYSIS CATHETER PLACEMENT MEDICATIONS: None ANESTHESIA/SEDATION: None FLUOROSCOPY TIME:  Fluoroscopy Time: 0 minutes 12 seconds (3 mGy). COMPLICATIONS: None PROCEDURE: Informed written consent was obtained from the patient's family after a discussion of the risks, benefits, and alternatives to treatment. Questions regarding the procedure were encouraged and answered. The right neck was prepped with chlorhexidine in a sterile fashion, and a sterile drape was applied covering the operative field. Maximum barrier sterile technique with sterile gowns and gloves were used for the procedure. A timeout was performed prior to the initiation of the procedure. A micropuncture kit was utilized to access the right internal jugular vein under direct, real-time ultrasound guidance after the overlying soft tissues were anesthetized with 1% lidocaine with epinephrine. Ultrasound image documentation was performed. The microwire was kinked to measure appropriate catheter length. A stiff glidewire was advanced to the level of the IVC. A 15 cm hemodialysis catheter was then placed over the wire. Final catheter positioning was confirmed and documented with a spot radiographic image. The catheter aspirates and flushes normally. The catheter was flushed with appropriate volume heparin dwells. Dressings were applied. The patient tolerated the procedure well without immediate post procedural complication. IMPRESSION: Status post image guided right IJ temporary hemodialysis catheter for plasmapheresis. Signed, Dulcy Fanny. Dellia Nims, RPVI [facility] Electronically Signed   By: Corrie Mckusick D.O.   On: 01/21/2021 09:19   DG Chest Port 1 View  Result Date: 01/17/2021 CLINICAL DATA:  Optic neuritis EXAM: PORTABLE CHEST 1 VIEW COMPARISON:  None. FINDINGS: The heart size and mediastinal contours are within normal limits. No evidence of hilar or mediastinal  adenopathy. Both lungs are clear. Right cervical rib. Artifact from EKG leads. IMPRESSION: Negative chest. Electronically Signed   By: Monte Fantasia M.D.   On: 01/17/2021 04:33   MR ORBITS W CONTRAST  Result Date: 01/17/2021 CLINICAL DATA:  Vision loss, monocular. EXAM: MRI HEAD AND ORBITS WITH CONTRAST TECHNIQUE: Multiplanar, multiecho pulse sequences of the brain and surrounding structures were obtained with intravenous contrast. Multiplanar, multiecho pulse sequences of the orbits and surrounding structures were obtained including fat saturation techniques, after intravenous contrast administration. CONTRAST:  63m GADAVIST GADOBUTROL 1 MMOL/ML IV SOLN COMPARISON:  Same day MRI head/orbits. FINDINGS: Postcontrast imaging was performed to further evaluate findings on same day MRI due to poor contrast bolus timing on that study. There is asymmetric enhancement of the prechiasmatic, intracanalicular and intraorbital right optic nerve. The optic chiasm and left optic nerves do not appear to enhance. There also appears to be mild enhancement of the optic nerve sheath in these regions. No masslike enhancement. As mentioned on same day MRI, the right optic nerve is mildly enlarged diffusely relative to the left. Surrounding intra orbital fat appears somewhat fuzzy on the right. Question faint enhancement of a small right periventricular lesion (series 7, image 9). Otherwise, no abnormal enhancement outside of the orbits/optic nerves. IMPRESSION: 1. Postcontrast imaging of the head and orbits was performed to further evaluate finding seen on same day MRI. There is asymmetric enhancement and mild enlargement of the prechiasmatic, intracanalicular, and intraorbital right optic nerve, favored to represent optic neuritis from demyelination given white matter lesions suggestive of demyelination on the same day MRI. Idiopathic perineuritis (  pseudotumor) or infectious optic neuritis are thought less likely. 2. Question  faint enhancement of a small right periventricular lesion (series 7, image 9), which may represent an area of active demyelination. Otherwise, no abnormal enhancement outside of the orbits/optic nerves. Electronically Signed   By: Margaretha Sheffield MD   On: 01/17/2021 07:43   MR ORBITS W WO CONTRAST  Result Date: 01/17/2021 CLINICAL DATA:  Initial evaluation for acute right-sided visual disturbance, suspected optic neuritis. EXAM: MRI HEAD AND ORBITS WITHOUT AND WITH CONTRAST TECHNIQUE: Multiplanar, multiecho pulse sequences of the brain and surrounding structures were obtained without and with intravenous contrast. Multiplanar, multiecho pulse sequences of the orbits and surrounding structures were obtained including fat saturation techniques, before and after intravenous contrast administration. CONTRAST:  2m GADAVIST GADOBUTROL 1 MMOL/ML IV SOLN COMPARISON:  None available. FINDINGS: MRI HEAD FINDINGS Brain: Examination is somewhat technically limited as while the patient did receive IV contrast material for this exam, little to no contrast material is seen within the brain on postcontrast sequences, limiting assessment. Cerebral volume within normal limits. There are scattered patchy T2/FLAIR hyperintensities involving the periventricular and deep white matter of both cerebral hemispheres. A few probable scattered subtle juxtacortical lesions noted as well. Several of these foci radiate from the lateral ventricles in a perpendicular fashion. A few infratentorial lesions involving the left dorsal midbrain and right pons noted (series 12, images 9, 7). For reference purposes, the largest discrete lesion seen at the deep white matter of the right frontal centrum semi ovale and measures 1 cm (series 12, image 15). Multiple corresponding T1 black holes. Findings are most consistent with demyelinating disease/multiple sclerosis. Several of these lesions demonstrate associated T2 shine through, with no definite  restricted diffusion to suggest active demyelination. Again, evaluation for associated enhancement is fairly limited on this exam as little to no contrast material is on board for postcontrast sequences. No evidence for acute or subacute infarct. Gray-white matter differentiation maintained. No encephalomalacia to suggest chronic cortical infarction. No foci of susceptibility artifact to suggest acute or chronic intracranial hemorrhage. No mass lesion, midline shift or mass effect. No hydrocephalus or extra-axial fluid collection. Pituitary gland suprasellar region normal. Midline structures intact. Vascular: Major intracranial vascular flow voids are maintained. Skull and upper cervical spine: Craniocervical junction within normal limits. Bone marrow signal intensity normal. No scalp soft tissue abnormality. Other: No mastoid effusion.  Inner ear structures grossly normal. MRI ORBITS FINDINGS Orbits: Globes are symmetric in size with normal morphology bilaterally. While evaluation for possible optic neuritis is limited given the relative lack of IV contrast on this exam, there is subtle asymmetric enlargement of the right optic nerve as compared to the left. Additionally, the nerve and/or nerve sheath itself appears somewhat fuzzy and irregular as compared to the contralateral left nerve (series 2, image 8). Findings also seen on corresponding coronal sequence (series 5, image 18). Given the patient history and corresponding findings in the brain, findings are highly suspicious for acute optic neuritis. No abnormality seen about the orbital apices. Optic chiasm normally situated within the suprasellar cistern without abnormality. No sellar or suprasellar mass. Extra-ocular muscles symmetric and within normal limits. Lacrimal glands normal. Superior orbital veins symmetric and within normal limits. No abnormality about the cavernous sinus. Visualized sinuses: Visualized paranasal sinuses are clear. Soft tissues:  Unremarkable. IMPRESSION: 1. Technically limited exam as while contrast was administered for this study, little to no contrast is seen onboard on postcontrast sequences, somewhat limiting assessment. 2. Patchy T2/FLAIR hyperintensities involving  the supratentorial and infratentorial cerebral white matter as above, most consistent with demyelinating disease/multiple sclerosis. No definite evidence for active demyelination on this limited study. 3. Subtle asymmetric enlargement and irregularity of the right optic nerve as above, highly suspicious for acute optic neuritis, particularly given the patient history and concomitant findings within the brain. Electronically Signed   By: Jeannine Boga M.D.   On: 01/17/2021 03:28

## 2021-01-23 ENCOUNTER — Inpatient Hospital Stay (HOSPITAL_COMMUNITY): Payer: Self-pay

## 2021-01-23 LAB — CBC
HCT: 40.4 % (ref 36.0–46.0)
Hemoglobin: 12.6 g/dL (ref 12.0–15.0)
MCH: 25.9 pg — ABNORMAL LOW (ref 26.0–34.0)
MCHC: 31.2 g/dL (ref 30.0–36.0)
MCV: 83 fL (ref 80.0–100.0)
Platelets: 216 10*3/uL (ref 150–400)
RBC: 4.87 MIL/uL (ref 3.87–5.11)
RDW: 13.4 % (ref 11.5–15.5)
WBC: 9.1 10*3/uL (ref 4.0–10.5)
nRBC: 0 % (ref 0.0–0.2)

## 2021-01-23 LAB — COMPREHENSIVE METABOLIC PANEL
ALT: 11 U/L (ref 0–44)
AST: 12 U/L — ABNORMAL LOW (ref 15–41)
Albumin: 3.7 g/dL (ref 3.5–5.0)
Alkaline Phosphatase: 8 U/L — ABNORMAL LOW (ref 38–126)
Anion gap: 3 — ABNORMAL LOW (ref 5–15)
BUN: 11 mg/dL (ref 6–20)
CO2: 29 mmol/L (ref 22–32)
Calcium: 8.6 mg/dL — ABNORMAL LOW (ref 8.9–10.3)
Chloride: 105 mmol/L (ref 98–111)
Creatinine, Ser: 0.93 mg/dL (ref 0.44–1.00)
GFR, Estimated: >60 mL/min (ref >60)
Glucose, Bld: 100 mg/dL — ABNORMAL HIGH (ref 70–99)
Potassium: 4.1 mmol/L (ref 3.5–5.1)
Sodium: 137 mmol/L (ref 135–145)
Total Bilirubin: 0.7 mg/dL (ref 0.3–1.2)
Total Protein: 4.4 g/dL — ABNORMAL LOW (ref 6.5–8.1)

## 2021-01-23 LAB — RESPIRATORY PANEL BY PCR

## 2021-01-23 LAB — C-REACTIVE PROTEIN: CRP: 0.6 mg/dL (ref <1.0)

## 2021-01-23 LAB — SARS CORONAVIRUS 2 (TAT 6-24 HRS): SARS Coronavirus 2: NEGATIVE

## 2021-01-23 LAB — MAGNESIUM: Magnesium: 1.9 mg/dL (ref 1.7–2.4)

## 2021-01-23 MED ORDER — ADULT MULTIVITAMIN W/MINERALS CH
1.0000 | ORAL_TABLET | Freq: Every day | ORAL | Status: DC
  Administered 2021-01-23 – 2021-01-28 (×6): 1 via ORAL
  Filled 2021-01-23 (×6): qty 1

## 2021-01-23 MED ORDER — MENTHOL 3 MG MT LOZG
1.0000 | LOZENGE | OROMUCOSAL | Status: DC | PRN
  Filled 2021-01-23: qty 9

## 2021-01-23 NOTE — Progress Notes (Signed)
PROGRESS NOTE                                                                                                                                                                                                             Patient Demographics:    Danielle York, is a 30 y.o. female, DOB - 26-May-1991, EOF:[id number]  Outpatient Primary MD for the patient is Pcp, No    LOS - 6  Admit date - 01/16/2021    Chief Complaint  Patient presents with   Eye Problem       Brief Narrative (HPI from H&P)  Danielle York is a 30 y.o. 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   Subjective:   Patient in bed, appears comfortable, denies any headache, no fever, no chest pain or pressure, no shortness of breath , no abdominal pain. No new focal weakness. R Eye vision better, mild sore throat.   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  2.  Low vitamin D levels and borderline vitamin B12 levels.  Placed on replacement.  3.  Mild sore throat and fatigue.  Check viral panel and rule out COVID.  Based on contact and droplet precautions for now.  We will also check inflammatory markers, chest x-ray stable no cough or shortness of breath       Condition - Fair  Family Communication  :    Mother Katharine Look [phone] on 01/17/2021, 01/19/21, 01/20/21, 01/23/2021,   Father bedside 01/21/21  Code Status :  Full  Consults  :  Neuro, IR  PUD Prophylaxis : None   Procedures  :     R.IJ HD Cath - 01/21/21  Disposition Plan  :    Status is: Inpatient  Remains inpatient appropriate because:IV treatments appropriate due to intensity of illness or inability to take PO  Dispo: The patient is from: Home              Anticipated d/c is to: Home              Patient currently is not medically stable to d/c.   Difficult to place patient Yes   DVT Prophylaxis  :    enoxaparin (LOVENOX) injection 40 mg Start: 01/17/21 1000    Lab Results  Component Value Date   PLT 216 01/23/2021    Diet :  Diet Order             Diet Heart Room service appropriate? Yes; Fluid consistency: Thin  Diet effective now                    Inpatient Medications  Scheduled Meds:  calcium carbonate  2 tablet Oral Q3H   Chlorhexidine Gluconate Cloth  6 each Topical Daily   cholecalciferol  1,000 Units Oral Daily   enoxaparin (LOVENOX) injection  40 mg Subcutaneous Q24H   heparin sodium (porcine)  1,000 Units Intracatheter Once   multivitamin with minerals  1 tablet Oral Daily   vitamin B-12  500 mcg Oral Daily   Continuous Infusions:  therapeutic plasma exchange solution     calcium gluconate     citrate dextrose      PRN Meds:.acetaminophen **OR** acetaminophen, acetaminophen, diphenhydrAMINE, lidocaine, loratadine, LORazepam, menthol-cetylpyridinium  Antibiotics  :    Anti-infectives (From admission, onward)    None        Time Spent in minutes  30   Lala Lund M.D on 01/23/2021 at 1:33 PM  To page go to [email or web address]   [facility] -  Office  [phone]    See all Orders from today for further details    Objective:   Vitals:   01/22/21 1848 01/22/21 2337 01/23/21 0520 01/23/21 1315  BP: (!) 108/96 (!) 93/53 92/62 108/71  Pulse: (!) 58 61 (!)  56 (!) 57  Resp: 16 16 15 17   Temp: 98.8 F (37.1 C) 98.2 F (36.8 C) 98.2 F (36.8 C) 98.2 F (36.8 C)  TempSrc: Oral Oral Oral Oral  SpO2: 98% 98% 98% 98%  Weight:   66.5 kg   Height:        Wt Readings from Last 3 Encounters:  01/23/21 66.5 kg     Intake/Output Summary (Last 24 hours) at 01/23/2021 1333 Last data filed at 01/23/2021 0533 Gross per 24 hour  Intake 240 ml  Output --  Net 240 ml      Physical Exam  Awake Alert, No new F.N deficits, right eye vision is improved [place].AT,PERRAL Supple Neck,No JVD, No cervical lymphadenopathy appriciated.  Symmetrical Chest wall movement, Good air movement bilaterally, CTAB RRR,No Gallops, Rubs or new Murmurs, No Parasternal Heave +ve B.Sounds, Abd Soft, No tenderness, No organomegaly appriciated, No rebound - guarding or rigidity. No Cyanosis, Clubbing or edema, No new Rash or bruise     Data Review:    CBC Recent Labs  Lab 01/16/21 2142 01/21/21 0510 01/23/21 0413  WBC 4.4 9.9 9.1  HGB 12.6 11.4* 12.6  HCT 41.2 35.7* 40.4  PLT 223 225 216  MCV 84.1 81.1 83.0  MCH 25.7* 25.9* 25.9*  MCHC 30.6 31.9 31.2  RDW 13.2 13.2 13.4  LYMPHSABS 2.0  --   --  MONOABS 0.3  --   --   EOSABS 0.1  --   --   BASOSABS 0.0  --   --     Recent Labs  Lab 01/16/21 2142 01/18/21 0730 01/21/21 0510 01/22/21 1943 01/23/21 0413  NA 138 136 136 138 137  K 3.7 3.9 3.8 3.9 4.1  CL 104 102 102 108 105  CO2 26 24 27 26 29   GLUCOSE 97 135* 125* 103* 100*  BUN 8 14 11 13 11   CREATININE 1.10* 1.04* 0.77 0.99 0.93  CALCIUM 9.7 9.5 8.9 8.6* 8.6*  AST 28  --   --   --  12*  ALT 21  --   --   --  11  ALKPHOS 30*  --   --   --  8*  BILITOT 1.1  --   --   --  0.7  ALBUMIN 4.2  --   --   --  3.7  MG  --   --   --   --  1.9    ------------------------------------------------------------------------------------------------------------------ No results for input(s): CHOL, HDL, LDLCALC, TRIG, CHOLHDL, LDLDIRECT in the last 72  hours.  No results found for: HGBA1C ------------------------------------------------------------------------------------------------------------------ No results for input(s): TSH, T4TOTAL, T3FREE, THYROIDAB in the last 72 hours.  Invalid input(s): FREET3  Cardiac Enzymes No results for input(s): CKMB, TROPONINI, MYOGLOBIN in the last 168 hours.  Invalid input(s): CK ------------------------------------------------------------------------------------------------------------------ No results found for: BNP  Micro Results Recent Results (from the past 240 hour(s))  SARS CORONAVIRUS 2 (TAT 6-24 HRS) Nasopharyngeal Nasopharyngeal Swab     Status: None   Collection Time: 01/17/21  3:47 AM   Specimen: Nasopharyngeal Swab  Result Value Ref Range Status   SARS Coronavirus 2 NEGATIVE NEGATIVE Final    Comment: (NOTE) SARS-CoV-2 target nucleic acids are NOT DETECTED.  The SARS-CoV-2 RNA is generally detectable in upper and lower respiratory specimens during the acute phase of infection. Negative results do not preclude SARS-CoV-2 infection, do not rule out co-infections with other pathogens, and should not be used as the sole basis for treatment or other patient management decisions. Negative results must be combined with clinical observations, patient history, and epidemiological information. The expected result is Negative.  Fact Sheet for Patients: SugarRoll.be  Fact Sheet for Healthcare Providers: https://www.woods-mathews.com/  This test is not yet approved or cleared by the [place] FDA and  has been authorized for detection and/or diagnosis of SARS-CoV-2 by FDA under an Emergency Use Authorization (EUA). This EUA will remain  in effect (meaning this test can be used) for the duration of the COVID-19 declaration under Se ction 564(b)(1) of the Act, 21 U.S.C. section 360bbb-3(b)(1), unless the authorization is terminated  or revoked sooner.  Performed at [facility], [place] [address], [place],  [address]     Radiology Reports MR BRAIN W CONTRAST  Result Date: 01/17/2021 CLINICAL DATA:  Vision loss, monocular. EXAM: MRI HEAD AND ORBITS WITH CONTRAST TECHNIQUE: Multiplanar, multiecho pulse sequences of the brain and surrounding structures were obtained with intravenous contrast. Multiplanar, multiecho pulse sequences of the orbits and surrounding structures were obtained including fat saturation techniques, after intravenous contrast administration. CONTRAST:  10m GADAVIST GADOBUTROL 1 MMOL/ML IV SOLN COMPARISON:  Same day MRI head/orbits. FINDINGS: Postcontrast imaging was performed to further evaluate findings on same day MRI due to poor contrast bolus timing on that study. There is asymmetric enhancement of the prechiasmatic, intracanalicular and intraorbital right optic nerve. The optic chiasm and left optic  nerves do not appear to enhance. There also appears to be mild enhancement of the optic nerve sheath in these regions. No masslike enhancement. As mentioned on same day MRI, the right optic nerve is mildly enlarged diffusely relative to the left. Surrounding intra orbital fat appears somewhat fuzzy on the right. Question faint enhancement of a small right periventricular lesion (series 7, image 9). Otherwise, no abnormal enhancement outside of the orbits/optic nerves. IMPRESSION: 1. Postcontrast imaging of the head and orbits was performed to further evaluate finding seen on same day MRI. There is asymmetric enhancement and mild enlargement of the prechiasmatic, intracanalicular, and intraorbital right optic nerve, favored to represent optic neuritis from demyelination given white matter lesions suggestive of demyelination on the same day MRI. Idiopathic perineuritis (pseudotumor) or infectious optic neuritis are thought less likely. 2. Question faint enhancement of a small right periventricular lesion  (series 7, image 9), which may represent an area of active demyelination. Otherwise, no abnormal enhancement outside of the orbits/optic nerves. Electronically Signed   By: Margaretha Sheffield MD   On: 01/17/2021 07:43   MR Brain W and Wo Contrast  Result Date: 01/17/2021 CLINICAL DATA:  Initial evaluation for acute right-sided visual disturbance, suspected optic neuritis. EXAM: MRI HEAD AND ORBITS WITHOUT AND WITH CONTRAST TECHNIQUE: Multiplanar, multiecho pulse sequences of the brain and surrounding structures were obtained without and with intravenous contrast. Multiplanar, multiecho pulse sequences of the orbits and surrounding structures were obtained including fat saturation techniques, before and after intravenous contrast administration. CONTRAST:  58m GADAVIST GADOBUTROL 1 MMOL/ML IV SOLN COMPARISON:  None available. FINDINGS: MRI HEAD FINDINGS Brain: Examination is somewhat technically limited as while the patient did receive IV contrast material for this exam, little to no contrast material is seen within the brain on postcontrast sequences, limiting assessment. Cerebral volume within normal limits. There are scattered patchy T2/FLAIR hyperintensities involving the periventricular and deep white matter of both cerebral hemispheres. A few probable scattered subtle juxtacortical lesions noted as well. Several of these foci radiate from the lateral ventricles in a perpendicular fashion. A few infratentorial lesions involving the left dorsal midbrain and right pons noted (series 12, images 9, 7). For reference purposes, the largest discrete lesion seen at the deep white matter of the right frontal centrum semi ovale and measures 1 cm (series 12, image 15). Multiple corresponding T1 black holes. Findings are most consistent with demyelinating disease/multiple sclerosis. Several of these lesions demonstrate associated T2 shine through, with no definite restricted diffusion to suggest active demyelination.  Again, evaluation for associated enhancement is fairly limited on this exam as little to no contrast material is on board for postcontrast sequences. No evidence for acute or subacute infarct. Gray-white matter differentiation maintained. No encephalomalacia to suggest chronic cortical infarction. No foci of susceptibility artifact to suggest acute or chronic intracranial hemorrhage. No mass lesion, midline shift or mass effect. No hydrocephalus or extra-axial fluid collection. Pituitary gland suprasellar region normal. Midline structures intact. Vascular: Major intracranial vascular flow voids are maintained. Skull and upper cervical spine: Craniocervical junction within normal limits. Bone marrow signal intensity normal. No scalp soft tissue abnormality. Other: No mastoid effusion.  Inner ear structures grossly normal. MRI ORBITS FINDINGS Orbits: Globes are symmetric in size with normal morphology bilaterally. While evaluation for possible optic neuritis is limited given the relative lack of IV contrast on this exam, there is subtle asymmetric enlargement of the right optic nerve as compared to the left. Additionally, the nerve and/or nerve sheath itself appears  somewhat fuzzy and irregular as compared to the contralateral left nerve (series 2, image 8). Findings also seen on corresponding coronal sequence (series 5, image 18). Given the patient history and corresponding findings in the brain, findings are highly suspicious for acute optic neuritis. No abnormality seen about the orbital apices. Optic chiasm normally situated within the suprasellar cistern without abnormality. No sellar or suprasellar mass. Extra-ocular muscles symmetric and within normal limits. Lacrimal glands normal. Superior orbital veins symmetric and within normal limits. No abnormality about the cavernous sinus. Visualized sinuses: Visualized paranasal sinuses are clear. Soft tissues: Unremarkable. IMPRESSION: 1. Technically limited exam as  while contrast was administered for this study, little to no contrast is seen onboard on postcontrast sequences, somewhat limiting assessment. 2. 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 No definite evidence for active demyelination on this limited study. 3. Subtle asymmetric enlargement and irregularity of the right optic nerve as above, highly suspicious for acute optic neuritis, particularly given the patient history and concomitant findings within the brain. Electronically Signed   By: Jeannine Boga M.D.   On: 01/17/2021 03:28   IR US Guide Vasc Access Right  Result Date: 01/21/2021 INDICATION: 30 year old female referred for temporary plasmapheresis catheter EXAM: ULTRASOUND-GUIDED ACCESS RIGHT IJ TEMPORARY HEMODIALYSIS CATHETER PLACEMENT MEDICATIONS: None ANESTHESIA/SEDATION: None FLUOROSCOPY TIME:  Fluoroscopy Time: 0 minutes 12 seconds (3 mGy). COMPLICATIONS: None PROCEDURE: Informed written consent was obtained from the patient's family after a discussion of the risks, benefits, and alternatives to treatment. Questions regarding the procedure were encouraged and answered. The right neck was prepped with chlorhexidine in a sterile fashion, and a sterile drape was applied covering the operative field. Maximum barrier sterile technique with sterile gowns and gloves were used for the procedure. A timeout was performed prior to the initiation of the procedure. A micropuncture kit was utilized to access the right internal jugular vein under direct, real-time ultrasound guidance after the overlying soft tissues were anesthetized with 1% lidocaine with epinephrine. Ultrasound image documentation was performed. The microwire was kinked to measure appropriate catheter length. A stiff glidewire was advanced to the level of the IVC. A 15 cm hemodialysis catheter was then  placed over the wire. Final catheter positioning was confirmed and documented with a spot radiographic image. The catheter aspirates and flushes normally. The catheter was flushed with appropriate volume heparin dwells. Dressings were applied. The patient tolerated the procedure well without immediate post procedural complication. IMPRESSION: Status post image guided right IJ temporary hemodialysis catheter for plasmapheresis. Signed, Dulcy Fanny. Dellia Nims, RPVI [facility] Electronically Signed   By: Corrie Mckusick D.O.   On: 01/21/2021 09:19   DG Chest Port 1 View  Result Date: 01/23/2021 CLINICAL DATA:  Shortness of breath. EXAM: PORTABLE CHEST 1 VIEW COMPARISON:  01/17/2021 FINDINGS: The cardiomediastinal silhouette is unremarkable. A RIGHT IJ central venous catheter is noted with tip overlying the LOWER SVC. There is no evidence of focal airspace disease, pulmonary edema, suspicious pulmonary nodule/mass, pleural effusion, or pneumothorax. No acute bony abnormalities are identified. IMPRESSION: No active disease. Electronically Signed   By: Margarette Canada M.D.   On: 01/23/2021 09:52   DG Chest Port 1 View  Result Date: 01/17/2021 CLINICAL DATA:  Optic neuritis EXAM: PORTABLE CHEST 1 VIEW COMPARISON:  None. FINDINGS: The heart size and mediastinal contours are within normal limits. No evidence of hilar or mediastinal adenopathy. Both lungs are clear.  Right cervical rib. Artifact from EKG leads. IMPRESSION: Negative chest. Electronically Signed   By: Monte Fantasia M.D.   On: 01/17/2021 04:33   MR ORBITS W CONTRAST  Result Date: 01/17/2021 CLINICAL DATA:  Vision loss, monocular. EXAM: MRI HEAD AND ORBITS WITH CONTRAST TECHNIQUE: Multiplanar, multiecho pulse sequences of the brain and surrounding structures were obtained with intravenous contrast. Multiplanar, multiecho pulse sequences of the orbits and surrounding structures were obtained including  fat saturation techniques, after intravenous contrast administration. CONTRAST:  58m GADAVIST GADOBUTROL 1 MMOL/ML IV SOLN COMPARISON:  Same day MRI head/orbits. FINDINGS: Postcontrast imaging was performed to further evaluate findings on same day MRI due to poor contrast bolus timing on that study. There is asymmetric enhancement of the prechiasmatic, intracanalicular and intraorbital right optic nerve. The optic chiasm and left optic nerves do not appear to enhance. There also appears to be mild enhancement of the optic nerve sheath in these regions. No masslike enhancement. As mentioned on same day MRI, the right optic nerve is mildly enlarged diffusely relative to the left. Surrounding intra orbital fat appears somewhat fuzzy on the right. Question faint enhancement of a small right periventricular lesion (series 7, image 9). Otherwise, no abnormal enhancement outside of the orbits/optic nerves. IMPRESSION: 1. Postcontrast imaging of the head and orbits was performed to further evaluate finding seen on same day MRI. There is asymmetric enhancement and mild enlargement of the prechiasmatic, intracanalicular, and intraorbital right optic nerve, favored to represent optic neuritis from demyelination given white matter lesions suggestive of demyelination on the same day MRI. Idiopathic perineuritis (pseudotumor) or infectious optic neuritis are thought less likely. 2. Question faint enhancement of a small right periventricular lesion (series 7, image 9), which may represent an area of active demyelination. Otherwise, no abnormal enhancement outside of the orbits/optic nerves. Electronically Signed   By: FMargaretha SheffieldMD   On: 01/17/2021 07:43   MR ORBITS W WO CONTRAST  Result Date: 01/17/2021 CLINICAL DATA:  Initial evaluation for acute right-sided visual disturbance, suspected optic neuritis. EXAM: MRI HEAD AND ORBITS WITHOUT AND WITH CONTRAST TECHNIQUE: Multiplanar, multiecho pulse sequences of the brain  and surrounding structures were obtained without and with intravenous contrast. Multiplanar, multiecho pulse sequences of the orbits and surrounding structures were obtained including fat saturation techniques, before and after intravenous contrast administration. CONTRAST:  513mGADAVIST GADOBUTROL 1 MMOL/ML IV SOLN COMPARISON:  None available. FINDINGS: MRI HEAD FINDINGS Brain: Examination is somewhat technically limited as while the patient did receive IV contrast material for this exam, little to no contrast material is seen within the brain on postcontrast sequences, limiting assessment. Cerebral volume within normal limits. There are scattered patchy T2/FLAIR hyperintensities involving the periventricular and deep white matter of both cerebral hemispheres. A few probable scattered subtle juxtacortical lesions noted as well. Several of these foci radiate from the lateral ventricles in a perpendicular fashion. A few infratentorial lesions involving the left dorsal midbrain and right pons noted (series 12, images 9, 7). For reference purposes, the largest discrete lesion seen at the deep white matter of the right frontal centrum semi ovale and measures 1 cm (series 12, image 15). Multiple corresponding T1 black holes. Findings are most consistent with demyelinating disease/multiple sclerosis. Several of these lesions demonstrate associated T2 shine through, with no definite restricted diffusion to suggest active demyelination. Again, evaluation for associated enhancement is fairly limited on this exam as little to no contrast material is on board for postcontrast sequences. No evidence for acute or subacute  infarct. Gray-white matter differentiation maintained. No encephalomalacia to suggest chronic cortical infarction. No foci of susceptibility artifact to suggest acute or chronic intracranial hemorrhage. No mass lesion, midline shift or mass effect. No hydrocephalus or extra-axial fluid collection. Pituitary  gland suprasellar region normal. Midline structures intact. Vascular: Major intracranial vascular flow voids are maintained. Skull and upper cervical spine: Craniocervical junction within normal limits. Bone marrow signal intensity normal. No scalp soft tissue abnormality. Other: No mastoid effusion.  Inner ear structures grossly normal. MRI ORBITS FINDINGS Orbits: Globes are symmetric in size with normal morphology bilaterally. While evaluation for possible optic neuritis is limited given the relative lack of IV contrast on this exam, there is subtle asymmetric enlargement of the right optic nerve as compared to the left. Additionally, the nerve and/or nerve sheath itself appears somewhat fuzzy and irregular as compared to the contralateral left nerve (series 2, image 8). Findings also seen on corresponding coronal sequence (series 5, image 18). Given the patient history and corresponding findings in the brain, findings are highly suspicious for acute optic neuritis. No abnormality seen about the orbital apices. Optic chiasm normally situated within the suprasellar cistern without abnormality. No sellar or suprasellar mass. Extra-ocular muscles symmetric and within normal limits. Lacrimal glands normal. Superior orbital veins symmetric and within normal limits. No abnormality about the cavernous sinus. Visualized sinuses: Visualized paranasal sinuses are clear. Soft tissues: Unremarkable. IMPRESSION: 1. Technically limited exam as while contrast was administered for this study, little to no contrast is seen onboard on postcontrast sequences, somewhat limiting assessment. 2. 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 No definite evidence for active demyelination on this limited study. 3. Subtle asymmetric enlargement and irregularity of the right optic nerve as above, highly suspicious  for acute optic neuritis, particularly given the patient history and concomitant findings within the brain. Electronically Signed   By: Jeannine Boga M.D.   On: 01/17/2021 03:28

## 2021-01-23 NOTE — Progress Notes (Addendum)
  Doing great today. Vision is 85% back in her R eye. Happy and tolerated second PLEX fine with no issues today. Did not have any questions for me today. Discussed with radiology and unfortunately with the temp HD line in place and the risk associated with it including infections and significant bleeding if this is accidentally pulled on, she cannot go home to get additional sessions of PLEX outpatient.  Erick Blinks [facility] Pager Number [phone]

## 2021-01-24 LAB — CBC WITH DIFFERENTIAL/PLATELET
Abs Immature Granulocytes: 0.17 10*3/uL — ABNORMAL HIGH (ref 0.00–0.07)
Basophils Absolute: 0 10*3/uL (ref 0.0–0.1)
Basophils Relative: 0 %
Eosinophils Absolute: 0.2 10*3/uL (ref 0.0–0.5)
Eosinophils Relative: 2 %
HCT: 37.7 % (ref 36.0–46.0)
Hemoglobin: 12.1 g/dL (ref 12.0–15.0)
Immature Granulocytes: 2 %
Lymphocytes Relative: 33 %
Lymphs Abs: 2.9 10*3/uL (ref 0.7–4.0)
MCH: 26.3 pg (ref 26.0–34.0)
MCHC: 32.1 g/dL (ref 30.0–36.0)
MCV: 82 fL (ref 80.0–100.0)
Monocytes Absolute: 0.7 10*3/uL (ref 0.1–1.0)
Monocytes Relative: 8 %
Neutro Abs: 5.1 10*3/uL (ref 1.7–7.7)
Neutrophils Relative %: 55 %
Platelets: 203 10*3/uL (ref 150–400)
RBC: 4.6 MIL/uL (ref 3.87–5.11)
RDW: 13.7 % (ref 11.5–15.5)
WBC: 9.1 10*3/uL (ref 4.0–10.5)
nRBC: 0 % (ref 0.0–0.2)

## 2021-01-24 LAB — COMPREHENSIVE METABOLIC PANEL
ALT: 12 U/L (ref 0–44)
AST: 13 U/L — ABNORMAL LOW (ref 15–41)
Albumin: 3.5 g/dL (ref 3.5–5.0)
Alkaline Phosphatase: 14 U/L — ABNORMAL LOW (ref 38–126)
Anion gap: 6 (ref 5–15)
BUN: 8 mg/dL (ref 6–20)
CO2: 28 mmol/L (ref 22–32)
Calcium: 8.4 mg/dL — ABNORMAL LOW (ref 8.9–10.3)
Chloride: 102 mmol/L (ref 98–111)
Creatinine, Ser: 0.85 mg/dL (ref 0.44–1.00)
GFR, Estimated: >60 mL/min (ref >60)
Glucose, Bld: 96 mg/dL (ref 70–99)
Potassium: 4.1 mmol/L (ref 3.5–5.1)
Sodium: 136 mmol/L (ref 135–145)
Total Bilirubin: 0.6 mg/dL (ref 0.3–1.2)
Total Protein: 4.8 g/dL — ABNORMAL LOW (ref 6.5–8.1)

## 2021-01-24 LAB — C-REACTIVE PROTEIN: CRP: 0.6 mg/dL (ref <1.0)

## 2021-01-24 LAB — MAGNESIUM: Magnesium: 2.1 mg/dL (ref 1.7–2.4)

## 2021-01-24 MED ORDER — CALCIUM GLUCONATE-NACL 2-0.675 GM/100ML-% IV SOLN
2.0000 g | Freq: Once | INTRAVENOUS | Status: AC
  Administered 2021-01-24: 2000 mg via INTRAVENOUS
  Filled 2021-01-24: qty 100

## 2021-01-24 MED ORDER — HEPARIN SODIUM (PORCINE) 1000 UNIT/ML IJ SOLN: 1000.0000 [IU] | Freq: Once | INTRAMUSCULAR | Status: AC

## 2021-01-24 MED ORDER — ACETAMINOPHEN 325 MG PO TABS
650.0000 mg | ORAL_TABLET | ORAL | Status: DC | PRN
  Administered 2021-01-24: 650 mg via ORAL

## 2021-01-24 MED ORDER — DIPHENHYDRAMINE HCL 25 MG PO CAPS
ORAL_CAPSULE | ORAL | Status: AC
  Filled 2021-01-24: qty 1

## 2021-01-24 MED ORDER — ACD FORMULA A 0.73-2.45-2.2 GM/100ML VI SOLN
1000.0000 mL | Status: DC
  Administered 2021-01-24: 1000 mL
  Filled 2021-01-24: qty 1000

## 2021-01-24 MED ORDER — HEPARIN SODIUM (PORCINE) 1000 UNIT/ML IJ SOLN
INTRAMUSCULAR | Status: AC
  Administered 2021-01-24: 1000 [IU]
  Filled 2021-01-24: qty 3

## 2021-01-24 MED ORDER — SODIUM CHLORIDE 0.9 % IV SOLN
INTRAVENOUS | Status: AC
  Filled 2021-01-24 (×3): qty 200

## 2021-01-24 MED ORDER — LORATADINE 10 MG PO TABS
10.0000 mg | ORAL_TABLET | Freq: Every day | ORAL | Status: DC
  Administered 2021-01-24 – 2021-01-28 (×5): 10 mg via ORAL
  Filled 2021-01-24 (×4): qty 1

## 2021-01-24 MED ORDER — SODIUM CHLORIDE 0.9 % IV SOLN
Freq: Once | INTRAVENOUS | Status: DC
  Filled 2021-01-24: qty 200

## 2021-01-24 MED ORDER — ACETAMINOPHEN 325 MG PO TABS
ORAL_TABLET | ORAL | Status: AC
  Filled 2021-01-24: qty 2

## 2021-01-24 MED ORDER — CALCIUM CARBONATE ANTACID 500 MG PO CHEW
CHEWABLE_TABLET | ORAL | Status: AC
  Administered 2021-01-24: 400 mg via ORAL
  Filled 2021-01-24: qty 2

## 2021-01-24 MED ORDER — DIPHENHYDRAMINE HCL 25 MG PO CAPS
25.0000 mg | ORAL_CAPSULE | Freq: Four times a day (QID) | ORAL | Status: DC | PRN
  Administered 2021-01-24: 25 mg via ORAL
  Filled 2021-01-24: qty 1

## 2021-01-24 MED ORDER — CALCIUM CARBONATE ANTACID 500 MG PO CHEW
2.0000 | CHEWABLE_TABLET | ORAL | Status: AC
Start: 2021-01-24 — End: 2021-01-24
  Administered 2021-01-24: 400 mg via ORAL
  Filled 2021-01-24: qty 2

## 2021-01-24 NOTE — Progress Notes (Signed)
PROGRESS NOTE                                                                                                                                                                                                             Patient Demographics:    Danielle York, is a 30 y.o. female, DOB - 30-Jan-1991, EQA:[id number]  Outpatient Primary MD for the patient is Pcp, No    LOS - 7  Admit date - 01/16/2021    Chief Complaint  Patient presents with   Eye Problem       Brief Narrative (HPI from H&P)  Danielle York is a 30 y.o. 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   Subjective:   Patient in bed appears to be in no distress right eye vision much better, no headache, no focal deficits, sore throat has improved.   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  2.  Low vitamin D levels and borderline vitamin B12 levels.  Placed on replacement.  3.  Mild sore throat and fatigue.  Negative COVID screen and viral panel, check for strep throat and Monospot.  Symptoms better requested to gargle with lukewarm water 3-4 times, does have history of allergies as well.       Condition - Fair  Family Communication  :   Mother Katharine Look [phone] on 01/17/2021, 01/19/21, 01/20/21, 01/23/2021,    Father bedside 01/21/21  Code Status :  Full  Consults  :  Neuro, IR  PUD Prophylaxis : None   Procedures  :     R.IJ HD Cath - 01/21/21      Disposition Plan  :    Status is: Inpatient  Remains inpatient appropriate  because:IV treatments appropriate due to intensity of illness or inability to take PO  Dispo: The patient is from: Home              Anticipated d/c is to: Home              Patient currently is not medically stable to d/c.   Difficult to place patient Yes   DVT Prophylaxis  :    enoxaparin (LOVENOX) injection 40 mg Start: 01/17/21 1000    Lab Results  Component Value Date   PLT 203 01/24/2021    Diet :  Diet Order             Diet Heart Room service appropriate? Yes; Fluid consistency: Thin  Diet effective now                    Inpatient Medications  Scheduled Meds:  calcium carbonate       calcium carbonate  2 tablet Oral Q3H   Chlorhexidine Gluconate Cloth  6 each Topical Daily   cholecalciferol  1,000 Units Oral Daily   diphenhydrAMINE       enoxaparin (LOVENOX) injection  40 mg Subcutaneous Q24H   heparin sodium (porcine)       heparin sodium (porcine)  1,000 Units Intracatheter Once   multivitamin with minerals  1 tablet Oral Daily   vitamin B-12  500 mcg Oral Daily   Continuous Infusions:  therapeutic plasma exchange solution     calcium gluconate 2,000 mg (01/24/21 1019)   citrate dextrose      PRN Meds:.acetaminophen **OR** acetaminophen, acetaminophen, diphenhydrAMINE, lidocaine, loratadine, LORazepam, menthol-cetylpyridinium  Antibiotics  :    Anti-infectives (From admission, onward)    None        Time Spent in minutes  30   Lala Lund M.D on 01/24/2021 at 11:45 AM  To page go to [email or web address]   [facility] -  Office  [phone]    See all Orders from today for further details    Objective:   Vitals:   01/24/21 1047 01/24/21 1117 01/24/21 1120 01/24/21 1143  BP: 104/60 99/61 98/62   101/63  Pulse: 69 66  67  Resp:    10  Temp:    98.7 F (37.1 C)  TempSrc:    Oral  SpO2:    99%  Weight:      Height:        Wt Readings from Last 3 Encounters:  01/24/21 66.7 kg     Intake/Output Summary (Last 24 hours) at 01/24/2021 1145 Last data filed at 01/24/2021 0816 Gross per 24 hour  Intake 240 ml  Output --  Net 240 ml      Physical Exam  Awake Alert, No new F.N deficits, Normal affect [place].AT,PERRAL Supple Neck,No JVD, No cervical lymphadenopathy appriciated.  Symmetrical Chest wall movement, Good air movement bilaterally, CTAB RRR,No Gallops, Rubs or new Murmurs, No Parasternal Heave +ve B.Sounds, Abd Soft, No tenderness, No organomegaly appriciated, No rebound - guarding or rigidity. No Cyanosis, Clubbing or edema, No new Rash or bruise     Data Review:    CBC Recent Labs  Lab 01/21/21 0510 01/23/21 0413 01/24/21 0334  WBC 9.9 9.1 9.1  HGB 11.4* 12.6 12.1  HCT 35.7* 40.4 37.7  PLT 225 216 203  MCV 81.1 83.0 82.0  MCH 25.9* 25.9* 26.3  MCHC 31.9 31.2 32.1  RDW 13.2 13.4 13.7  LYMPHSABS  --   --  2.9  MONOABS  --   --  0.7  EOSABS  --   --  0.2  BASOSABS  --   --  0.0    Recent Labs  Lab 01/18/21 0730 01/21/21 0510 01/22/21 1943 01/23/21 0413 01/24/21 0334  NA 136 136 138 137 136  K 3.9 3.8 3.9 4.1 4.1  CL 102 102 108 105 102  CO2 24 27 26 29 28   GLUCOSE 135* 125* 103* 100* 96  BUN 14 11 13 11 8   CREATININE 1.04* 0.77 0.99 0.93 0.85  CALCIUM 9.5 8.9 8.6* 8.6* 8.4*  AST  --   --   --  12* 13*  ALT  --   --   --  11 12  ALKPHOS  --   --   --  8* 14*  BILITOT  --   --   --  0.7 0.6  ALBUMIN  --   --   --  3.7 3.5  MG  --   --   --  1.9 2.1  CRP  --   --  0.6  --  0.6    ------------------------------------------------------------------------------------------------------------------ No results for input(s): CHOL, HDL, LDLCALC, TRIG, CHOLHDL, LDLDIRECT in the last 72 hours.  No results found for:  HGBA1C ------------------------------------------------------------------------------------------------------------------ No results for input(s): TSH, T4TOTAL, T3FREE, THYROIDAB in the last 72 hours.  Invalid input(s): FREET3  Cardiac Enzymes No results for input(s): CKMB, TROPONINI, MYOGLOBIN in the last 168 hours.  Invalid input(s): CK ------------------------------------------------------------------------------------------------------------------ No results found for: BNP   Radiology Reports MR BRAIN W CONTRAST  Result Date: 01/17/2021 CLINICAL DATA:  Vision loss, monocular. EXAM: MRI HEAD AND ORBITS WITH CONTRAST TECHNIQUE: Multiplanar, multiecho pulse sequences of the brain and surrounding structures were obtained with intravenous contrast. Multiplanar, multiecho pulse sequences of the orbits and surrounding structures were obtained including fat saturation techniques, after intravenous contrast administration. CONTRAST:  48m GADAVIST GADOBUTROL 1 MMOL/ML IV SOLN COMPARISON:  Same day MRI head/orbits. FINDINGS: Postcontrast imaging was performed to further evaluate findings on same day MRI due to poor contrast bolus timing on that study. There is asymmetric enhancement of the prechiasmatic, intracanalicular and intraorbital right optic nerve. The optic chiasm and left optic nerves do not appear to enhance. There also appears to be mild enhancement of the optic nerve sheath in these regions. No masslike enhancement. As mentioned on same day MRI, the right optic nerve is mildly enlarged diffusely relative to the left. Surrounding intra orbital fat appears somewhat fuzzy on the right. Question faint enhancement of a small right periventricular lesion (series 7, image 9). Otherwise, no abnormal enhancement outside of the orbits/optic nerves. IMPRESSION: 1. Postcontrast imaging of the head and orbits was performed to further evaluate finding seen on same day MRI. There is asymmetric enhancement and  mild enlargement of the prechiasmatic, intracanalicular, and intraorbital right optic nerve, favored to represent optic neuritis from demyelination given white matter lesions suggestive of demyelination on the same day MRI. Idiopathic perineuritis (pseudotumor) or infectious optic neuritis are thought less likely. 2. Question faint enhancement of a small right periventricular lesion (series 7, image 9), which may represent an area of active demyelination. Otherwise, no abnormal enhancement outside of the orbits/optic nerves. Electronically Signed   By: FMargaretha SheffieldMD   On: 01/17/2021 07:43   MR Brain W and Wo Contrast  Result Date: 01/17/2021 CLINICAL DATA:  Initial evaluation for acute right-sided visual disturbance, suspected optic neuritis. EXAM: MRI HEAD AND ORBITS WITHOUT AND WITH CONTRAST TECHNIQUE: Multiplanar, multiecho pulse sequences of the brain and surrounding structures  were obtained without and with intravenous contrast. Multiplanar, multiecho pulse sequences of the orbits and surrounding structures were obtained including fat saturation techniques, before and after intravenous contrast administration. CONTRAST:  26m GADAVIST GADOBUTROL 1 MMOL/ML IV SOLN COMPARISON:  None available. FINDINGS: MRI HEAD FINDINGS Brain: Examination is somewhat technically limited as while the patient did receive IV contrast material for this exam, little to no contrast material is seen within the brain on postcontrast sequences, limiting assessment. Cerebral volume within normal limits. There are scattered patchy T2/FLAIR hyperintensities involving the periventricular and deep white matter of both cerebral hemispheres. A few probable scattered subtle juxtacortical lesions noted as well. Several of these foci radiate from the lateral ventricles in a perpendicular fashion. A few infratentorial lesions involving the left dorsal midbrain and right pons noted (series 12, images 9, 7). For reference purposes, the  largest discrete lesion seen at the deep white matter of the right frontal centrum semi ovale and measures 1 cm (series 12, image 15). Multiple corresponding T1 black holes. Findings are most consistent with demyelinating disease/multiple sclerosis. Several of these lesions demonstrate associated T2 shine through, with no definite restricted diffusion to suggest active demyelination. Again, evaluation for associated enhancement is fairly limited on this exam as little to no contrast material is on board for postcontrast sequences. No evidence for acute or subacute infarct. Gray-white matter differentiation maintained. No encephalomalacia to suggest chronic cortical infarction. No foci of susceptibility artifact to suggest acute or chronic intracranial hemorrhage. No mass lesion, midline shift or mass effect. No hydrocephalus or extra-axial fluid collection. Pituitary gland suprasellar region normal. Midline structures intact. Vascular: Major intracranial vascular flow voids are maintained. Skull and upper cervical spine: Craniocervical junction within normal limits. Bone marrow signal intensity normal. No scalp soft tissue abnormality. Other: No mastoid effusion.  Inner ear structures grossly normal. MRI ORBITS FINDINGS Orbits: Globes are symmetric in size with normal morphology bilaterally. While evaluation for possible optic neuritis is limited given the relative lack of IV contrast on this exam, there is subtle asymmetric enlargement of the right optic nerve as compared to the left. Additionally, the nerve and/or nerve sheath itself appears somewhat fuzzy and irregular as compared to the contralateral left nerve (series 2, image 8). Findings also seen on corresponding coronal sequence (series 5, image 18). Given the patient history and corresponding findings in the brain, findings are highly suspicious for acute optic neuritis. No abnormality seen about the orbital apices. Optic chiasm normally situated within  the suprasellar cistern without abnormality. No sellar or suprasellar mass. Extra-ocular muscles symmetric and within normal limits. Lacrimal glands normal. Superior orbital veins symmetric and within normal limits. No abnormality about the cavernous sinus. Visualized sinuses: Visualized paranasal sinuses are clear. Soft tissues: Unremarkable. IMPRESSION: 1. Technically limited exam as while contrast was administered for this study, little to no contrast is seen onboard on postcontrast sequences, somewhat limiting assessment. 2. 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 No definite evidence for active demyelination on this limited study. 3. Subtle asymmetric enlargement and irregularity of the right optic nerve as above, highly suspicious for acute optic neuritis, particularly given the patient history and concomitant findings within the brain. Electronically Signed   By: BJeannine BogaM.D.   On: 01/17/2021 03:28   IR [place] Vasc Access Right  Result Date: 01/21/2021 INDICATION: 30year old female referred for temporary plasmapheresis catheter EXAM: ULTRASOUND-GUIDED ACCESS RIGHT IJ TEMPORARY HEMODIALYSIS CATHETER PLACEMENT MEDICATIONS:  None ANESTHESIA/SEDATION: None FLUOROSCOPY TIME:  Fluoroscopy Time: 0 minutes 12 seconds (3 mGy). COMPLICATIONS: None PROCEDURE: Informed written consent was obtained from the patient's family after a discussion of the risks, benefits, and alternatives to treatment. Questions regarding the procedure were encouraged and answered. The right neck was prepped with chlorhexidine in a sterile fashion, and a sterile drape was applied covering the operative field. Maximum barrier sterile technique with sterile gowns and gloves were used for the procedure. A timeout was performed prior to the initiation of the procedure. A micropuncture kit was  utilized to access the right internal jugular vein under direct, real-time ultrasound guidance after the overlying soft tissues were anesthetized with 1% lidocaine with epinephrine. Ultrasound image documentation was performed. The microwire was kinked to measure appropriate catheter length. A stiff glidewire was advanced to the level of the IVC. A 15 cm hemodialysis catheter was then placed over the wire. Final catheter positioning was confirmed and documented with a spot radiographic image. The catheter aspirates and flushes normally. The catheter was flushed with appropriate volume heparin dwells. Dressings were applied. The patient tolerated the procedure well without immediate post procedural complication. IMPRESSION: Status post image guided right IJ temporary hemodialysis catheter for plasmapheresis. Signed, Dulcy Fanny. Dellia Nims, RPVI [facility] Electronically Signed   By: Corrie Mckusick D.O.   On: 01/21/2021 09:19   DG Chest Port 1 View  Result Date: 01/23/2021 CLINICAL DATA:  Shortness of breath. EXAM: PORTABLE CHEST 1 VIEW COMPARISON:  01/17/2021 FINDINGS: The cardiomediastinal silhouette is unremarkable. A RIGHT IJ central venous catheter is noted with tip overlying the LOWER SVC. There is no evidence of focal airspace disease, pulmonary edema, suspicious pulmonary nodule/mass, pleural effusion, or pneumothorax. No acute bony abnormalities are identified. IMPRESSION: No active disease. Electronically Signed   By: Margarette Canada M.D.   On: 01/23/2021 09:52   DG Chest Port 1 View  Result Date: 01/17/2021 CLINICAL DATA:  Optic neuritis EXAM: PORTABLE CHEST 1 VIEW COMPARISON:  None. FINDINGS: The heart size and mediastinal contours are within normal limits. No evidence of hilar or mediastinal adenopathy. Both lungs are clear. Right cervical rib. Artifact from EKG leads. IMPRESSION: Negative chest. Electronically Signed   By: Monte Fantasia M.D.    On: 01/17/2021 04:33   MR ORBITS W CONTRAST  Result Date: 01/17/2021 CLINICAL DATA:  Vision loss, monocular. EXAM: MRI HEAD AND ORBITS WITH CONTRAST TECHNIQUE: Multiplanar, multiecho pulse sequences of the brain and surrounding structures were obtained with intravenous contrast. Multiplanar, multiecho pulse sequences of the orbits and surrounding structures were obtained including fat saturation techniques, after intravenous contrast administration. CONTRAST:  31m GADAVIST GADOBUTROL 1 MMOL/ML IV SOLN COMPARISON:  Same day MRI head/orbits. FINDINGS: Postcontrast imaging was performed to further evaluate findings on same day MRI due to poor contrast bolus timing on that study. There is asymmetric enhancement of the prechiasmatic, intracanalicular and intraorbital right optic nerve. The optic chiasm and left optic nerves do not appear to enhance. There also appears to be mild enhancement of the optic nerve sheath in these regions. No masslike enhancement. As mentioned on same day MRI, the right optic nerve is mildly enlarged diffusely relative to the left. Surrounding intra orbital fat appears somewhat fuzzy on the right. Question faint enhancement of a small right periventricular lesion (series 7, image 9). Otherwise, no abnormal enhancement outside of the orbits/optic nerves. IMPRESSION: 1. Postcontrast imaging of the head and orbits was performed to further evaluate finding seen on  same day MRI. There is asymmetric enhancement and mild enlargement of the prechiasmatic, intracanalicular, and intraorbital right optic nerve, favored to represent optic neuritis from demyelination given white matter lesions suggestive of demyelination on the same day MRI. Idiopathic perineuritis (pseudotumor) or infectious optic neuritis are thought less likely. 2. Question faint enhancement of a small right periventricular lesion (series 7, image 9), which may represent an area of active demyelination. Otherwise, no abnormal  enhancement outside of the orbits/optic nerves. Electronically Signed   By: Margaretha Sheffield MD   On: 01/17/2021 07:43   MR ORBITS W WO CONTRAST  Result Date: 01/17/2021 CLINICAL DATA:  Initial evaluation for acute right-sided visual disturbance, suspected optic neuritis. EXAM: MRI HEAD AND ORBITS WITHOUT AND WITH CONTRAST TECHNIQUE: Multiplanar, multiecho pulse sequences of the brain and surrounding structures were obtained without and with intravenous contrast. Multiplanar, multiecho pulse sequences of the orbits and surrounding structures were obtained including fat saturation techniques, before and after intravenous contrast administration. CONTRAST:  56m GADAVIST GADOBUTROL 1 MMOL/ML IV SOLN COMPARISON:  None available. FINDINGS: MRI HEAD FINDINGS Brain: Examination is somewhat technically limited as while the patient did receive IV contrast material for this exam, little to no contrast material is seen within the brain on postcontrast sequences, limiting assessment. Cerebral volume within normal limits. There are scattered patchy T2/FLAIR hyperintensities involving the periventricular and deep white matter of both cerebral hemispheres. A few probable scattered subtle juxtacortical lesions noted as well. Several of these foci radiate from the lateral ventricles in a perpendicular fashion. A few infratentorial lesions involving the left dorsal midbrain and right pons noted (series 12, images 9, 7). For reference purposes, the largest discrete lesion seen at the deep white matter of the right frontal centrum semi ovale and measures 1 cm (series 12, image 15). Multiple corresponding T1 black holes. Findings are most consistent with demyelinating disease/multiple sclerosis. Several of these lesions demonstrate associated T2 shine through, with no definite restricted diffusion to suggest active demyelination. Again, evaluation for associated enhancement is fairly limited on this exam as little to no contrast  material is on board for postcontrast sequences. No evidence for acute or subacute infarct. Gray-white matter differentiation maintained. No encephalomalacia to suggest chronic cortical infarction. No foci of susceptibility artifact to suggest acute or chronic intracranial hemorrhage. No mass lesion, midline shift or mass effect. No hydrocephalus or extra-axial fluid collection. Pituitary gland suprasellar region normal. Midline structures intact. Vascular: Major intracranial vascular flow voids are maintained. Skull and upper cervical spine: Craniocervical junction within normal limits. Bone marrow signal intensity normal. No scalp soft tissue abnormality. Other: No mastoid effusion.  Inner ear structures grossly normal. MRI ORBITS FINDINGS Orbits: Globes are symmetric in size with normal morphology bilaterally. While evaluation for possible optic neuritis is limited given the relative lack of IV contrast on this exam, there is subtle asymmetric enlargement of the right optic nerve as compared to the left. Additionally, the nerve and/or nerve sheath itself appears somewhat fuzzy and irregular as compared to the contralateral left nerve (series 2, image 8). Findings also seen on corresponding coronal sequence (series 5, image 18). Given the patient history and corresponding findings in the brain, findings are highly suspicious for acute optic neuritis. No abnormality seen about the orbital apices. Optic chiasm normally situated within the suprasellar cistern without abnormality. No sellar or suprasellar mass. Extra-ocular muscles symmetric and within normal limits. Lacrimal glands normal. Superior orbital veins symmetric and within normal limits. No abnormality about the cavernous sinus. Visualized sinuses:  Visualized paranasal sinuses are clear. Soft tissues: Unremarkable. IMPRESSION: 1. Technically limited exam as while contrast was administered for this study, little to no contrast is seen onboard on postcontrast  sequences, somewhat limiting assessment. 2. 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 No definite evidence for active demyelination on this limited study. 3. Subtle asymmetric enlargement and irregularity of the right optic nerve as above, highly suspicious for acute optic neuritis, particularly given the patient history and concomitant findings within the brain. Electronically Signed   By: Jeannine Boga M.D.   On: 01/17/2021 03:28

## 2021-01-24 NOTE — Progress Notes (Signed)
NEUROLOGY CONSULTATION PROGRESS NOTE   Date of service: January 24, 2021 Patient Name: Danielle York MRN:  [id number] DOB:  30-Apr-1991  Brief HPI  30 yo female who presented with painful vision loss of OD. MRI with demyelination and findings consistent with right optic neuritis.  She has multiple non enhancing T2/FLAIR lesions on MRI along with an enhancement of the R optic nerve. The presence of enhancing and multiple non enhancing lesions simultaneously on MRI provides evidence for dissemination in time and space. 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  Completed 5 doses IVMP     Interval Hx   Vision improved OD to nearly 90% per the patient. With PLEX, reports some right facial swelling, non tender. Reports similar sensation when she had a tooth infection on the same side last year. No drooping of the face reported.  Vitals   Vitals:   01/23/21 1737 01/23/21 2256 01/24/21 0530 01/24/21 0816  BP: 108/61 (!) 95/57 105/64 107/66  Pulse: 74 65 66 71  Resp: 17 16 18    Temp: 98.7 F (37.1 C) 98.9 F (37.2 C) 98.5 F (36.9 C) 98 F (36.7 C)  TempSrc: Oral Oral Oral Oral  SpO2: 98% 98% 100% 99%  Weight:   65.9 kg   Height:         Body mass index is 23.45 kg/m.  Physical Exam   General: walking around in the room; in no acute distress.  HENT: Normal external appearance of ears and nose.  CV: No JVD. No peripheral edema.  Pulmonary: Symmetric Chest rise. Normal respiratory effort.  Ext: No cyanosis, edema, or deformity  Skin: No rash. Normal palpation of skin.   Neurologic Examination  Mental status/Cognition: Alert, conversant, oriented to self. Speech/language: Fluent, comprehension intact. Cranial nerves: VA much improved OD, normal OS, PERRL, EOMI, VFF, face symmetric, tongue and palate midline. Motor:  Muscle bulk: normal, tone normal Coordination/Complex Motor:  No dysmetria  Labs   Basic Metabolic Panel:  Lab Results   Component Value Date   NA 136 01/24/2021   K 4.1 01/24/2021   CO2 28 01/24/2021   GLUCOSE 96 01/24/2021   BUN 8 01/24/2021   CREATININE 0.85 01/24/2021   CALCIUM 8.4 (L) 01/24/2021   GFRNONAA >60 01/24/2021   Imaging and Diagnostic studies   MRI Brain and Orbits:   Brain:  -Examination is somewhat technically limited as while the patient did receive IV contrast material for this exam, little to no contrast material is seen within the brain on postcontrast sequences, limiting assessment. -Cerebral volume within normal limits. There are scattered patchy T2/FLAIR hyperintensities involving the periventricular and deep white matter of both cerebral hemispheres. A few probable scattered subtle juxtacortical lesions noted as well. Several of these foci radiate from the lateral ventricles in a perpendicular fashion. A few infratentorial lesions involving the left dorsal midbrain and right pons noted (series 12, images 9, 7). For reference purposes, the largest discrete lesion seen at the deep white matter of the right frontal centrum semi ovale and measures 1 cm (series 12, image 15). Multiple corresponding T1 black holes. Findings are most consistent with demyelinating disease/multiple sclerosis. Several of these lesions demonstrate associated T2 shine through, with no definite restricted diffusion to suggest active demyelination. Again, evaluation for associated enhancement is fairly limited on this exam as little to no contrast material is on board for postcontrast sequences. Orbits: -Post contrast imaging of the head and orbits was performed to further evaluate finding  seen on same day MRI. There is asymmetric enhancement and mild enlargement of the prechiasmatic, intracanalicular, and intraorbital right optic nerve, favored to represent optic neuritis from demyelination given white matter lesions suggestive of demyelination on the same day MRI. Idiopathic perineuritis  (pseudotumor) or infectious optic neuritis are thought less likely. -Question faint enhancement of a small right periventricular lesion (series 7, image 9), which may represent an area of active demyelination. Otherwise, no abnormal enhancement outside of the orbits/optic nerves.   Impression   Danielle York is a 30 y.o. female presented with painful vision loss of OD. 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 Simultaneous presence of enhancing R optic nerve along with multiple non enhancing lesions on MRI provides evidence for dissemination in time and space. She fits the 2017 McDonald criteria for diagnosis of Multiple Sclerosis.Marland Kitchen Her neurologic examination is notable for R eye central greater than peripheral vision loss.  S/p Steroids with some improvement in vision but still significant blurriness, could only  make out fingers. Unable to read anything with her R eye, hence recommended PLEX, which she has gone for total .2 rounds of PLEX with significant improvement in visual acuity of the right eye  Recommendations  - Total 5 PLEX (once every other day) - NMO Panel, ACE, MOG Ab negative. - Follow up with Dr. Despina Arias with [facility] as outpatient - Follow up with Ophthalmology as outpatient. ______________________________________________________________________  -- Milon Dikes, MD Neurologist [facility] Pager: [phone]

## 2021-01-25 ENCOUNTER — Inpatient Hospital Stay (HOSPITAL_COMMUNITY): Payer: Self-pay

## 2021-01-25 LAB — C-REACTIVE PROTEIN: CRP: 0.6 mg/dL (ref <1.0)

## 2021-01-25 LAB — CBC WITH DIFFERENTIAL/PLATELET
Abs Immature Granulocytes: 0.13 10*3/uL — ABNORMAL HIGH (ref 0.00–0.07)
Basophils Absolute: 0 10*3/uL (ref 0.0–0.1)
Basophils Relative: 0 %
Eosinophils Absolute: 0.2 10*3/uL (ref 0.0–0.5)
Eosinophils Relative: 4 %
HCT: 36.5 % (ref 36.0–46.0)
Hemoglobin: 11.8 g/dL — ABNORMAL LOW (ref 12.0–15.0)
Immature Granulocytes: 2 %
Lymphocytes Relative: 45 %
Lymphs Abs: 2.9 10*3/uL (ref 0.7–4.0)
MCH: 26.6 pg (ref 26.0–34.0)
MCHC: 32.3 g/dL (ref 30.0–36.0)
MCV: 82.2 fL (ref 80.0–100.0)
Monocytes Absolute: 0.5 10*3/uL (ref 0.1–1.0)
Monocytes Relative: 8 %
Neutro Abs: 2.6 10*3/uL (ref 1.7–7.7)
Neutrophils Relative %: 41 %
Platelets: 198 10*3/uL (ref 150–400)
RBC: 4.44 MIL/uL (ref 3.87–5.11)
RDW: 13.8 % (ref 11.5–15.5)
WBC: 6.3 10*3/uL (ref 4.0–10.5)
nRBC: 0 % (ref 0.0–0.2)

## 2021-01-25 LAB — COMPREHENSIVE METABOLIC PANEL
ALT: 15 U/L (ref 0–44)
AST: 16 U/L (ref 15–41)
Albumin: 3.6 g/dL (ref 3.5–5.0)
Alkaline Phosphatase: 10 U/L — ABNORMAL LOW (ref 38–126)
Anion gap: 3 — ABNORMAL LOW (ref 5–15)
BUN: 9 mg/dL (ref 6–20)
CO2: 31 mmol/L (ref 22–32)
Calcium: 8.5 mg/dL — ABNORMAL LOW (ref 8.9–10.3)
Chloride: 103 mmol/L (ref 98–111)
Creatinine, Ser: 0.92 mg/dL (ref 0.44–1.00)
GFR, Estimated: >60 mL/min (ref >60)
Glucose, Bld: 96 mg/dL (ref 70–99)
Potassium: 4 mmol/L (ref 3.5–5.1)
Sodium: 137 mmol/L (ref 135–145)
Total Bilirubin: 0.6 mg/dL (ref 0.3–1.2)
Total Protein: 4.5 g/dL — ABNORMAL LOW (ref 6.5–8.1)

## 2021-01-25 LAB — MAGNESIUM: Magnesium: 2 mg/dL (ref 1.7–2.4)

## 2021-01-25 LAB — MONONUCLEOSIS SCREEN: Mono Screen: NEGATIVE

## 2021-01-25 IMAGING — DX DG ORTHOPANTOGRAM /PANORAMIC
1 series · 1 of 1 positions shown · non-contrast
Comparison: None.

CLINICAL DATA: Facial abscess.  Right-sided jaw pain.

EXAM:
ORTHOPANTOGRAM/PANORAMIC

[view not recorded]
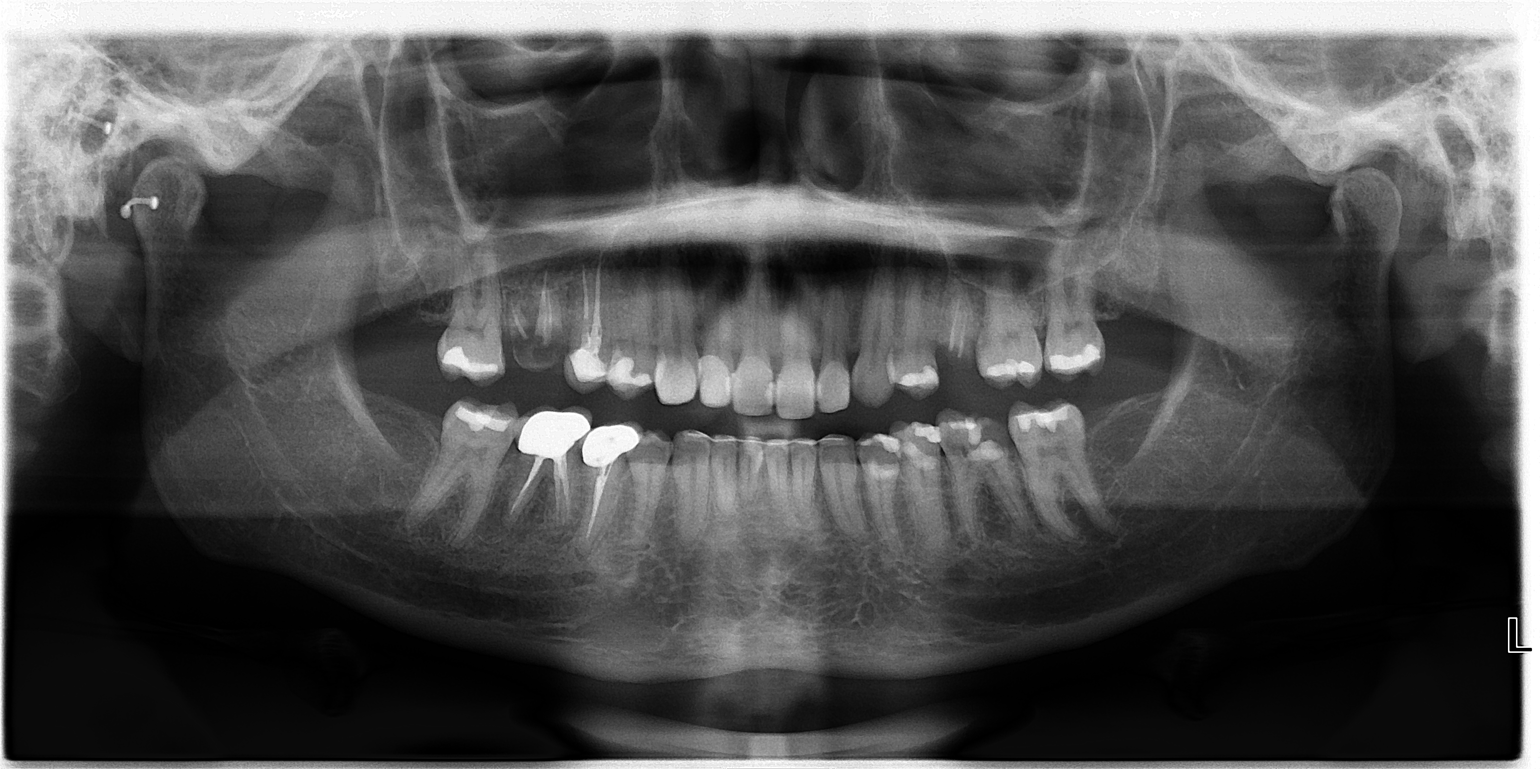

[1 of 1 positions shown; findings below may reference images not displayed]

FINDINGS: There are prominent dental caries involving first maxillary molar
teeth on both the right and the left. Multiple dental fillings and
previous root canal procedures are noted. No definite periapical
erosion is identified.
IMPRESSION: Dental caries without a periapical abscess identified.

## 2021-01-25 MED ORDER — WHITE PETROLATUM EX OINT
TOPICAL_OINTMENT | CUTANEOUS | Status: AC
  Administered 2021-01-26: 0.2
  Filled 2021-01-25: qty 28.35

## 2021-01-26 LAB — CBC WITH DIFFERENTIAL/PLATELET
Abs Immature Granulocytes: 0.1 10*3/uL — ABNORMAL HIGH (ref 0.00–0.07)
Basophils Absolute: 0 10*3/uL (ref 0.0–0.1)
Basophils Relative: 0 %
Eosinophils Absolute: 0.2 10*3/uL (ref 0.0–0.5)
Eosinophils Relative: 3 %
HCT: 36.2 % (ref 36.0–46.0)
Hemoglobin: 11.4 g/dL — ABNORMAL LOW (ref 12.0–15.0)
Immature Granulocytes: 2 %
Lymphocytes Relative: 46 %
Lymphs Abs: 2.7 10*3/uL (ref 0.7–4.0)
MCH: 26.1 pg (ref 26.0–34.0)
MCHC: 31.5 g/dL (ref 30.0–36.0)
MCV: 83 fL (ref 80.0–100.0)
Monocytes Absolute: 0.5 10*3/uL (ref 0.1–1.0)
Monocytes Relative: 8 %
Neutro Abs: 2.5 10*3/uL (ref 1.7–7.7)
Neutrophils Relative %: 41 %
Platelets: 194 10*3/uL (ref 150–400)
RBC: 4.36 MIL/uL (ref 3.87–5.11)
RDW: 14 % (ref 11.5–15.5)
WBC: 5.9 10*3/uL (ref 4.0–10.5)
nRBC: 0 % (ref 0.0–0.2)

## 2021-01-26 LAB — COMPREHENSIVE METABOLIC PANEL
ALT: 25 U/L (ref 0–44)
AST: 21 U/L (ref 15–41)
Albumin: 3.6 g/dL (ref 3.5–5.0)
Alkaline Phosphatase: 14 U/L — ABNORMAL LOW (ref 38–126)
Anion gap: 5 (ref 5–15)
BUN: 7 mg/dL (ref 6–20)
CO2: 30 mmol/L (ref 22–32)
Calcium: 8.6 mg/dL — ABNORMAL LOW (ref 8.9–10.3)
Chloride: 102 mmol/L (ref 98–111)
Creatinine, Ser: 0.8 mg/dL (ref 0.44–1.00)
GFR, Estimated: >60 mL/min (ref >60)
Glucose, Bld: 96 mg/dL (ref 70–99)
Potassium: 4 mmol/L (ref 3.5–5.1)
Sodium: 137 mmol/L (ref 135–145)
Total Bilirubin: 0.5 mg/dL (ref 0.3–1.2)
Total Protein: 4.8 g/dL — ABNORMAL LOW (ref 6.5–8.1)

## 2021-01-26 LAB — POCT I-STAT, CHEM 8
BUN: 6 mg/dL (ref 6–20)
Calcium, Ion: 1.18 mmol/L (ref 1.15–1.40)
Chloride: 97 mmol/L — ABNORMAL LOW (ref 98–111)
Creatinine, Ser: 0.9 mg/dL (ref 0.44–1.00)
Glucose, Bld: 113 mg/dL — ABNORMAL HIGH (ref 70–99)
HCT: 38 % (ref 36.0–46.0)
Hemoglobin: 12.9 g/dL (ref 12.0–15.0)
Potassium: 3.9 mmol/L (ref 3.5–5.1)
Sodium: 139 mmol/L (ref 135–145)
TCO2: 27 mmol/L (ref 22–32)

## 2021-01-26 LAB — CULTURE, GROUP A STREP (THRC): Special Requests: NORMAL

## 2021-01-26 LAB — MAGNESIUM: Magnesium: 2.1 mg/dL (ref 1.7–2.4)

## 2021-01-26 LAB — C-REACTIVE PROTEIN: CRP: 0.6 mg/dL (ref <1.0)

## 2021-01-26 MED ORDER — DIPHENHYDRAMINE HCL 25 MG PO CAPS: 25.0000 mg | ORAL_CAPSULE | Freq: Four times a day (QID) | ORAL | Status: DC | PRN

## 2021-01-26 MED ORDER — CALCIUM CARBONATE ANTACID 500 MG PO CHEW
CHEWABLE_TABLET | ORAL | Status: AC
  Administered 2021-01-26: 400 mg
  Filled 2021-01-26: qty 2

## 2021-01-26 MED ORDER — ACETAMINOPHEN 325 MG PO TABS: 650.0000 mg | ORAL_TABLET | ORAL | Status: DC | PRN

## 2021-01-26 MED ORDER — CALCIUM GLUCONATE-NACL 2-0.675 GM/100ML-% IV SOLN
INTRAVENOUS | Status: AC
  Filled 2021-01-26: qty 100

## 2021-01-26 MED ORDER — HEPARIN SODIUM (PORCINE) 1000 UNIT/ML IJ SOLN
1000.0000 [IU] | Freq: Once | INTRAMUSCULAR | Status: AC
  Filled 2021-01-26: qty 1

## 2021-01-26 MED ORDER — CALCIUM GLUCONATE-NACL 2-0.675 GM/100ML-% IV SOLN
2.0000 g | Freq: Once | INTRAVENOUS | Status: AC
  Filled 2021-01-26: qty 100

## 2021-01-26 MED ORDER — HEPARIN SODIUM (PORCINE) 1000 UNIT/ML IJ SOLN
INTRAMUSCULAR | Status: AC
  Administered 2021-01-26: 1000 [IU]
  Filled 2021-01-26: qty 3

## 2021-01-26 MED ORDER — DIPHENHYDRAMINE HCL 25 MG PO CAPS
ORAL_CAPSULE | ORAL | Status: AC
  Administered 2021-01-26: 25 mg via ORAL
  Filled 2021-01-26: qty 1

## 2021-01-26 MED ORDER — CALCIUM CARBONATE ANTACID 500 MG PO CHEW
CHEWABLE_TABLET | ORAL | Status: AC
  Filled 2021-01-26: qty 2

## 2021-01-26 MED ORDER — ACD FORMULA A 0.73-2.45-2.2 GM/100ML VI SOLN
Status: AC
  Administered 2021-01-26: 1000 mL
  Filled 2021-01-26: qty 500

## 2021-01-26 MED ORDER — CALCIUM GLUCONATE-NACL 2-0.675 GM/100ML-% IV SOLN
INTRAVENOUS | Status: AC
  Administered 2021-01-26: 2000 mg via INTRAVENOUS
  Filled 2021-01-26: qty 100

## 2021-01-26 MED ORDER — ACETAMINOPHEN 325 MG PO TABS
ORAL_TABLET | ORAL | Status: AC
  Administered 2021-01-26: 650 mg via ORAL
  Filled 2021-01-26: qty 2

## 2021-01-26 MED ORDER — ACD FORMULA A 0.73-2.45-2.2 GM/100ML VI SOLN
1000.0000 mL | Status: DC
  Filled 2021-01-26: qty 1000

## 2021-01-26 MED ORDER — ALBUMIN HUMAN 25 % IV SOLN
INTRAVENOUS | Status: AC
  Filled 2021-01-26 (×3): qty 200

## 2021-01-26 NOTE — Progress Notes (Signed)
Neurology Progress Note  S: Vision is much improved. She is now able to see TV and make out what is on there. Can read small print on TV. Says her hand in front of her face is clearing, now described as a white shadow instead of missing hand on NP last exam. No n/v, pain, OD pain, or HA. Painful tooth on right side with slight facial swelling. Has dental appointment scheduled.   O: Current vital signs: BP 91/65 (BP Location: Left Arm)   Pulse 67   Temp 97.7 F (36.5 C) (Oral)   Resp 16   Ht 5\' 6"  (1.676 m)   Wt 66.3 kg   LMP 01/11/2021 (Approximate)   SpO2 98%   BMI 23.60 kg/m  Vital signs in last 24 hours: Temp:  [97.7 F (36.5 C)-99.1 F (37.3 C)] 97.7 F (36.5 C) (08/10 0425) Pulse Rate:  [67-94] 67 (08/10 0425) Resp:  [16-17] 16 (08/10 0425) BP: (91-114)/(65-77) 91/65 (08/10 0425) SpO2:  [94 %-100 %] 98 % (08/10 0425)  GENERAL: Very well appearing female. Awake, alert in NAD. Sitting up on couch in room working on her computer.  HEENT: Normocephalic and atraumatic. Very slight swelling noted in right cheek.  LUNGS: Normal respiratory effort.  CV: RRR.  Ext: warm. PSYCH: light affect.   NEURO:  Mental Status: Alert and oriented.  Speech/Language: speech is without aphasia or dysarthria.  Naming, repetition, fluency, and comprehension intact.  Cranial Nerves:  II: PERRL. Visual fields full even in central vision OD.  III, IV, VI: EOMI. Eyelids elevate symmetrically.  V: Sensation is intact to light touch and symmetrical to face.  VII: Smile is symmetrical.   Medications  Current Facility-Administered Medications:    acetaminophen (TYLENOL) tablet 650 mg, 650 mg, Oral, Q4H PRN, 650 mg at 01/26/21 0729 **OR** acetaminophen (TYLENOL) suppository 650 mg, 650 mg, Rectal, Q4H PRN, 03/28/21, MD   Chlorhexidine Gluconate Cloth 2 % PADS 6 each, 6 each, Topical, Daily, Erick Blinks, MD, 6 each at 01/26/21 03/28/21   cholecalciferol (VITAMIN D3) tablet 1,000 Units,  1,000 Units, Oral, Daily, 5573, MD, 1,000 Units at 01/26/21 0915   enoxaparin (LOVENOX) injection 40 mg, 40 mg, Subcutaneous, Q24H, Rathore, Vasundhra, MD, 40 mg at 01/18/21 0959   lidocaine (XYLOCAINE) 1 % (with pres) injection, , , PRN, 03/20/21, DO, 10 mL at 01/21/21 0852   loratadine (CLARITIN) tablet 10 mg, 10 mg, Oral, Daily PRN, 03/23/21, MD, 10 mg at 01/17/21 2058   loratadine (CLARITIN) tablet 10 mg, 10 mg, Oral, Daily, 2059 K, MD, 10 mg at 01/26/21 0916   LORazepam (ATIVAN) tablet 0.5 mg, 0.5 mg, Oral, BID PRN, 03/28/21, MD, 0.5 mg at 01/18/21 2132   menthol-cetylpyridinium (CEPACOL) lozenge 3 mg, 1 lozenge, Oral, PRN, 2133, MD   multivitamin with minerals tablet 1 tablet, 1 tablet, Oral, Daily, Leroy Sea, MD, 1 tablet at 01/26/21 0915   vitamin B-12 (CYANOCOBALAMIN) tablet 500 mcg, 500 mcg, Oral, Daily, 03/28/21, MD, 500 mcg at 01/26/21 0915  Imaging No new imaging.  Assessment: 30 yo female who presented with painful vision loss OD. Not enough improvement with steroids, so PLEX was started. She reports 90% vision improvement. Will need f/up with Dr. 34 as MRI with evidence of findings consistent with MS.   Impression: -Optic neuritis.  -Dental caries on tooth xray with abscess.    Recommendations/Plan:  -Continue PLEX, 4th dose today. Last dose on 01/28/21.  -f/up  with Dr. Epimenio Foot of [facility] due to demyelination of MRI.  -Keep dental appointment. -Out patient ophthalmology.   Pt seen by Jimmye Norman, MSN, APN-BC/Nurse Practitioner/Neuro Pager: [phone]

## 2021-01-26 NOTE — Progress Notes (Signed)
PROGRESS NOTE    Danielle York  QJF:[id number] DOB: 1991/01/09 DOA: 01/16/2021 PCP: Pcp, No    Chief Complaint  Patient presents with   Eye Problem    Brief Narrative:   Danielle York is a 30 y.o. 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   Assessment & Plan:   Principal Problem:   Optic neuritis Active Problems:   Dysuria   Acute right eye pain with vision loss probably secondary to optic neuritis.  Neurology consulted and recommended IV solumedrol course which has completed.  Plex started and to be finished on 01/28/21.  Vision has improved. She is back to baseline.    Mild sore throat and fatigue:  Improved.  Monospot is negative.  Strep throat pending.    Vitamin d deficiency:  Replacement ordered.       DVT prophylaxis: (Lovenox) Code Status: (Full code) Family Communication: None at bedside.  Disposition:   Status is: Inpatient  Remains inpatient appropriate because:IV treatments appropriate due to intensity of illness or inability to take PO  Dispo: The patient is from: Home              Anticipated d/c is to: Home              Patient currently is not medically stable to d/c.   Difficult to place patient No       Consultants:  Neurology.   Procedures: none.   Antimicrobials: none.   Subjective: No new complaints.   Objective: Vitals:   01/25/21 1654 01/26/21 0003 01/26/21 0425 01/26/21 1234  BP: 109/77 98/66 91/65  136/81  Pulse: 94 76 67 83  Resp: 16 17 16 16   Temp: 99.1 F (37.3 C) 98.1 F (36.7 C) 97.7 F (36.5 C) 98.8 F (37.1 C)  TempSrc: Oral Oral Oral Oral  SpO2: 94% 99% 98% 100%  Weight:      Height:        Intake/Output Summary (Last 24 hours) at 01/26/2021 1517 Last data filed at 01/25/2021  1808 Gross per 24 hour  Intake 480 ml  Output --  Net 480 ml   Filed Weights   01/24/21 0530 01/24/21 1000 01/25/21 0514  Weight: 65.9 kg 66.7 kg 66.3 kg    Examination:  General exam: Appears calm and comfortable  Respiratory system: Clear to auscultation. Respiratory effort normal. Cardiovascular system: S1 & S2 heard, RRR. No JVD,  No pedal edema. Gastrointestinal system: Abdomen is nondistended, soft and nontender.  Normal bowel sounds heard. Central nervous system: Alert and oriented. No focal neurological deficits. Extremities: Symmetric 5 x 5 power. Skin: No rashes, lesions or ulcers Psychiatry: Mood & affect appropriate.     Data Reviewed: I have personally reviewed following labs and imaging studies  CBC: Recent Labs  Lab 01/21/21 0510 01/23/21 0413 01/24/21 0334 01/25/21 0356 01/26/21 0223  WBC 9.9 9.1 9.1 6.3 5.9  NEUTROABS  --   --  5.1 2.6 2.5  HGB 11.4* 12.6 12.1 11.8* 11.4*  HCT 35.7* 40.4 37.7 36.5 36.2  MCV 81.1 83.0 82.0 82.2 83.0  PLT 225 216 203 198 194    Basic Metabolic Panel: Recent Labs  Lab 01/22/21 1943 01/23/21 0413 01/24/21 0334 01/25/21 0356 01/26/21 0223  NA 138 137 136 137 137  K 3.9 4.1 4.1 4.0  4.0  CL 108 105 102 103 102  CO2 26 29 28 31 30   GLUCOSE 103* 100* 96 96 96  BUN 13 11 8 9 7   CREATININE 0.99 0.93 0.85 0.92 0.80  CALCIUM 8.6* 8.6* 8.4* 8.5* 8.6*  MG  --  1.9 2.1 2.0 2.1    GFR: Estimated Creatinine Clearance: 98.9 mL/min (by C-G formula based on SCr of 0.8 mg/dL).  Liver Function Tests: Recent Labs  Lab 01/23/21 0413 01/24/21 0334 01/25/21 0356 01/26/21 0223  AST 12* 13* 16 21  ALT 11 12 15 25   ALKPHOS 8* 14* 10* 14*  BILITOT 0.7 0.6 0.6 0.5  PROT 4.4* 4.8* 4.5* 4.8*  ALBUMIN 3.7 3.5 3.6 3.6    CBG: No results for input(s): GLUCAP in the last 168 hours.   Recent Results (from the past 240 hour(s))  SARS CORONAVIRUS 2 (TAT 6-24 HRS) Nasopharyngeal Nasopharyngeal Swab     Status: None    Collection Time: 01/17/21  3:47 AM   Specimen: Nasopharyngeal Swab  Result Value Ref Range Status   SARS Coronavirus 2 NEGATIVE NEGATIVE Final    Comment: (NOTE) SARS-CoV-2 target nucleic acids are NOT DETECTED.  The SARS-CoV-2 RNA is generally detectable in upper and lower respiratory specimens during the acute phase of infection. Negative results do not preclude SARS-CoV-2 infection, do not rule out co-infections with other pathogens, and should not be used as the sole basis for treatment or other patient management decisions. Negative results must be combined with clinical observations, patient history, and epidemiological information. The expected result is Negative.  Fact Sheet for Patients: 03/28/21  Fact Sheet for Healthcare Providers:  This test is not yet approved or cleared by the 03/19/21 FDA and  has been authorized for detection and/or diagnosis of SARS-CoV-2 by FDA under an Emergency Use Authorization (EUA). This EUA will remain  in effect (meaning this test can be used) for the duration of the COVID-19 declaration under Se ction 564(b)(1) of the Act, 21 U.S.C. section 360bbb-3(b)(1), unless the authorization is terminated or revoked sooner.  Performed at [facility], [address], [place], [facility] [address]   SARS CORONAVIRUS 2 (TAT 6-24 HRS) Nasopharyngeal Nasopharyngeal Swab     Status: None   Collection Time: 01/23/21  8:20 AM   Specimen: Nasopharyngeal Swab  Result Value Ref Range Status   SARS Coronavirus 2 NEGATIVE NEGATIVE Final    Comment: (NOTE) SARS-CoV-2 target nucleic acids are NOT DETECTED.  The SARS-CoV-2 RNA is generally detectable in upper and lower respiratory specimens during the acute phase of infection. Negative results do not preclude SARS-CoV-2 infection, do not rule out co-infections with other pathogens, and should not be used as the sole basis  for treatment or other patient management decisions. Negative results must be combined with clinical observations, patient history, and epidemiological information. The expected result is Negative.  Fact Sheet for Patients: [place]  Fact Sheet for Healthcare Providers: 86578  This test is not yet approved or cleared by the 03/25/21 FDA and  has been authorized for detection and/or diagnosis of SARS-CoV-2 by FDA under an Emergency Use Authorization (EUA). This EUA will remain  in effect (meaning this test can be used) for the duration of the COVID-19 declaration under Se ction 564(b)(1) of the Act, 21 U.S.C. section 360bbb-3(b)(1), unless the authorization is terminated or revoked sooner.  Performed at [facility], [address], [place], [facility] [address]   Respiratory (~20 pathogens) panel by PCR  Status: None   Collection Time: 01/23/21  8:20 AM   Specimen: Nasopharyngeal Swab; Respiratory  Result Value Ref Range Status   Adenovirus NOT DETECTED NOT DETECTED Final   Coronavirus 229E NOT DETECTED NOT DETECTED Final    Comment: (NOTE) The Coronavirus on the Respiratory Panel, DOES NOT test for the novel  Coronavirus (2019 nCoV)    Coronavirus HKU1 NOT DETECTED NOT DETECTED Final   Coronavirus NL63 NOT DETECTED NOT DETECTED Final   Coronavirus OC43 NOT DETECTED NOT DETECTED Final   Metapneumovirus NOT DETECTED NOT DETECTED Final   Rhinovirus / Enterovirus NOT DETECTED NOT DETECTED Final   Influenza A NOT DETECTED NOT DETECTED Final   Influenza B NOT DETECTED NOT DETECTED Final   Parainfluenza Virus 1 NOT DETECTED NOT DETECTED Final   Parainfluenza Virus 2 NOT DETECTED NOT DETECTED Final   Parainfluenza Virus 3 NOT DETECTED NOT DETECTED Final   Parainfluenza Virus 4 NOT DETECTED NOT DETECTED Final   Respiratory Syncytial Virus NOT DETECTED NOT DETECTED Final   Bordetella pertussis NOT  DETECTED NOT DETECTED Final   Bordetella Parapertussis NOT DETECTED NOT DETECTED Final   Chlamydophila pneumoniae NOT DETECTED NOT DETECTED Final   Mycoplasma pneumoniae NOT DETECTED NOT DETECTED Final    Comment: Performed at [facility], [address], [place], [place] [address]  Culture, group A strep     Status: None   Collection Time: 01/24/21  3:30 PM   Specimen: Throat  Result Value Ref Range Status   Specimen Description THROAT  Final   Special Requests Normal  Final   Culture   Final    NO GROUP A STREP (S.PYOGENES) ISOLATED Performed at [facility], [address], [place], [place] [address]    Report Status 01/26/2021 FINAL  Final         Radiology Studies: DG Orthopantogram  Result Date: 01/25/2021 CLINICAL DATA:  Facial abscess.  Right-sided jaw pain. EXAM: ORTHOPANTOGRAM/PANORAMIC COMPARISON:  None. FINDINGS: There are prominent dental caries involving first maxillary molar teeth on both the right and the left. Multiple dental fillings and previous root canal procedures are noted. No definite periapical erosion is identified. IMPRESSION: Dental caries without a periapical abscess identified. Electronically Signed   By: Sebastian Ache M.D.   On: 01/25/2021 12:48        Scheduled Meds:  Chlorhexidine Gluconate Cloth  6 each Topical Daily   cholecalciferol  1,000 Units Oral Daily   enoxaparin (LOVENOX) injection  40 mg Subcutaneous Q24H   loratadine  10 mg Oral Daily   multivitamin with minerals  1 tablet Oral Daily   vitamin B-12  500 mcg Oral Daily   Continuous Infusions:   LOS: 9 days        Kathlen Mody, MD [facility]   To contact the attending provider between 7A-7P or the covering provider during after hours 7P-7A, please log into the web site [email or web address] and access using universal [place] password for that web site. If you do not have the password, please call the hospital operator.  01/26/2021, 3:17 PM

## 2021-01-26 NOTE — Progress Notes (Signed)
RN will place consult for DBIV,when pt comes back from dialysis.

## 2021-01-26 NOTE — Progress Notes (Signed)
PROGRESS NOTE                                                                                                                                                                                                             Patient Demographics:    Danielle York, is a 30 y.o. female, DOB - 1990/09/27, HGD:[id number]  Outpatient Primary MD for the patient is Pcp, No    LOS - 9  Admit date - 01/16/2021    Chief Complaint  Patient presents with   Eye Problem       Brief Narrative (HPI from H&P)  Danielle York is a 30 y.o. 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   Subjective:   Patient in bed, appears comfortable, denies any headache, no fever, no chest pain or pressure, no shortness of breath , no abdominal pain. No new focal weakness. R eye vision and sore throat better, mild R. Facial swelling.   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  2.  Low vitamin D levels and borderline vitamin B12 levels.  Placed on replacement.  3.  Mild sore throat and fatigue.  Negative COVID screen and viral panel, check for strep throat and Monospot.  Symptoms better requested to gargle with lukewarm water 3-4 times, does have history of allergies as well, H2 blocker added.  4. Mild R facial  swelling - H/O caries will get Orthopantogram.       Condition - Fair  Family Communication  :   Mother Dois Davenport [phone] on 01/17/2021, 01/19/21, 01/20/21, 01/23/2021,   Father bedside 01/21/21  Code Status :  Full  Consults  :  Neuro, IR  PUD Prophylaxis : None   Procedures  :  R.IJ HD Cath - 01/21/21      Disposition Plan  :    Status is: Inpatient  Remains inpatient appropriate because:IV treatments appropriate due to intensity of illness or inability to take PO  Dispo: The patient is from: Home              Anticipated d/c is to: Home              Patient currently is not medically stable to d/c.   Difficult to place patient Yes   DVT Prophylaxis  :    enoxaparin (LOVENOX) injection 40 mg Start: 01/17/21 1000    Lab Results  Component Value Date   PLT 194 01/26/2021    Diet :  Diet Order             Diet Heart Room service appropriate? Yes; Fluid consistency: Thin  Diet effective now                    Inpatient Medications  Scheduled Meds:  Chlorhexidine Gluconate Cloth  6 each Topical Daily   cholecalciferol  1,000 Units Oral Daily   enoxaparin (LOVENOX) injection  40 mg Subcutaneous Q24H   loratadine  10 mg Oral Daily   multivitamin with minerals  1 tablet Oral Daily   vitamin B-12  500 mcg Oral Daily   Continuous Infusions:    PRN Meds:.acetaminophen **OR** acetaminophen, lidocaine, loratadine, LORazepam, menthol-cetylpyridinium  Antibiotics  :    Anti-infectives (From admission, onward)    None        Time Spent in minutes  30   Susa Raring M.D on 01/26/2021 at 3:19 PM  To page go to [email or web address]   [facility] -  Office  [phone]    See all Orders from today for further details    Objective:   Vitals:   01/25/21 1654 01/26/21 0003 01/26/21 0425 01/26/21 1234  BP: 109/77 98/66 91/65  136/81  Pulse: 94 76 67 83  Resp: 16 17 16 16   Temp: 99.1 F (37.3 C) 98.1 F (36.7 C) 97.7 F (36.5 C) 98.8 F  (37.1 C)  TempSrc: Oral Oral Oral Oral  SpO2: 94% 99% 98% 100%  Weight:      Height:        Wt Readings from Last 3 Encounters:  01/25/21 66.3 kg     Intake/Output Summary (Last 24 hours) at 01/26/2021 1519 Last data filed at 01/25/2021 1808 Gross per 24 hour  Intake 480 ml  Output --  Net 480 ml      Physical Exam  Awake Alert, No new F.N deficits, Normal affect [place].AT, mild R.facial swelling, R IJ HD Cath . Supple Neck,No JVD, No cervical lymphadenopathy appriciated.  Symmetrical Chest wall movement, Good air movement bilaterally, CTAB RRR,No Gallops, Rubs or new Murmurs, No Parasternal Heave +ve B.Sounds, Abd Soft, No tenderness, No organomegaly appriciated, No rebound - guarding or rigidity. No Cyanosis, Clubbing or edema, No new Rash or bruise    Data Review:    CBC Recent Labs  Lab 01/21/21 0510 01/23/21 0413 01/24/21 0334 01/25/21 0356 01/26/21 0223  WBC 9.9 9.1 9.1 6.3 5.9  HGB 11.4* 12.6 12.1 11.8* 11.4*  HCT 35.7* 40.4 37.7 36.5 36.2  PLT 225 216 203 198 194  MCV 81.1 83.0 82.0 82.2 83.0  MCH 25.9* 25.9* 26.3 26.6 26.1  MCHC 31.9 31.2 32.1 32.3 31.5  RDW 13.2 13.4 13.7 13.8 14.0  LYMPHSABS  --   --  2.9 2.9  2.7  MONOABS  --   --  0.7 0.5 0.5  EOSABS  --   --  0.2 0.2 0.2  BASOSABS  --   --  0.0 0.0 0.0    Recent Labs  Lab 01/22/21 1943 01/23/21 0413 01/24/21 0334 01/25/21 0356 01/26/21 0223  NA 138 137 136 137 137  K 3.9 4.1 4.1 4.0 4.0  CL 108 105 102 103 102  CO2 26 29 28 31 30   GLUCOSE 103* 100* 96 96 96  BUN 13 11 8 9 7   CREATININE 0.99 0.93 0.85 0.92 0.80  CALCIUM 8.6* 8.6* 8.4* 8.5* 8.6*  AST  --  12* 13* 16 21  ALT  --  11 12 15 25   ALKPHOS  --  8* 14* 10* 14*  BILITOT  --  0.7 0.6 0.6 0.5  ALBUMIN  --  3.7 3.5 3.6 3.6  MG  --  1.9 2.1 2.0 2.1  CRP 0.6  --  0.6 0.6 0.6    ------------------------------------------------------------------------------------------------------------------ No results for input(s): CHOL, HDL,  LDLCALC, TRIG, CHOLHDL, LDLDIRECT in the last 72 hours.  No results found for: HGBA1C ------------------------------------------------------------------------------------------------------------------ No results for input(s): TSH, T4TOTAL, T3FREE, THYROIDAB in the last 72 hours.  Invalid input(s): FREET3  Cardiac Enzymes No results for input(s): CKMB, TROPONINI, MYOGLOBIN in the last 168 hours.  Invalid input(s): CK ------------------------------------------------------------------------------------------------------------------ No results found for: BNP

## 2021-01-26 NOTE — Progress Notes (Signed)
Pt states that she did not get much rest last night and would like to know if we could not come in her room tonight for vitals and blood work. I informed pt that there are orders for lab work in morning. Pt states that she declines having her morning lab work done in the morning because the lab comes in too early and she would like to sleep. Pt did agree to allow staff to obtain last set of vitals at 2100, after this time pt has asked staff to let her sleep until 0600. Advised to call this RN if she has any pain or any questions throughout the night.

## 2021-01-27 NOTE — Progress Notes (Signed)
Patient refused labs to be drawn this morning. Patient stated "already told the nurse not to draw labs anymore". Hilda Lias RN aware.

## 2021-01-27 NOTE — Progress Notes (Signed)
PROGRESS NOTE    [name]  OEV:[id number] DOB: 1990-09-22 DOA: 01/16/2021 PCP: Pcp, No    Chief Complaint  Patient presents with   Eye Problem    Brief Narrative:   Danielle York is a 30 y.o. 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   Assessment & Plan:   Principal Problem:   Optic neuritis Active Problems:   Dysuria   Acute right eye pain with vision loss probably secondary to optic neuritis.  Neurology consulted and recommended IV solumedrol course which has completed.  PLEX treatments started with improvement in the vision and to be finished on 01/28/21.  Vision has improved. She is back to baseline.  Will need outpatient follow upwith Dr Epimenio Foot of [facility] due to demyelination of the MRI.    Mild sore throat and fatigue:  Improved.  Monospot is negative.  Strep throat pending.    Vitamin d deficiency:  Replacement ordered.    Will request IR to remove the RIJ temp HD cath to be removed tomorrow.  No new complaints.    DVT prophylaxis: (Lovenox) Code Status: (Full code) Family Communication: None at bedside.  Disposition:   Status is: Inpatient  Remains inpatient appropriate because:IV treatments appropriate due to intensity of illness or inability to take PO  Dispo: The patient is from: Home              Anticipated d/c is to: Home              Patient currently is not medically stable to d/c.   Difficult to place patient No       Consultants:  Neurology.   Procedures: none.   Antimicrobials: none.   Subjective: No complaints.   Objective: Vitals:   01/26/21 2104 01/27/21 0605 01/27/21 0800 01/27/21 1204  BP: 98/71 115/82 110/75 114/70  Pulse: 66 80 79 90  Resp: 14 16 17 18   Temp: 98.5 F (36.9 C) 98.4 F (36.9 C)  98.6 F (37 C) 98 F (36.7 C)  TempSrc: Oral Oral Oral   SpO2: 99% 100% 100% 100%  Weight:  66 kg    Height:        Intake/Output Summary (Last 24 hours) at 01/27/2021 1630 Last data filed at 01/27/2021 0500 Gross per 24 hour  Intake 1000 ml  Output --  Net 1000 ml    Filed Weights   01/24/21 1000 01/25/21 0514 01/27/21 0605  Weight: 66.7 kg 66.3 kg 66 kg    Examination:  General exam: Appears calm and comfortable  Respiratory system: clear to auscultation, no wheezing heard.  Cardiovascular system: S1S2, RRR no JVD, no pedal edema.  Gastrointestinal system: Abdomen is soft, NT ND BS+ Central nervous system: Alert and oriented, non focal.  Extremities: no pedal edema.  Skin: No rashes seen.  Psychiatry: Mood is appropriate.     Data Reviewed: I have personally reviewed following labs and imaging studies  CBC: Recent Labs  Lab 01/21/21 0510 01/23/21 0413 01/24/21 0334 01/25/21 0356 01/26/21 0223 01/26/21 1719  WBC 9.9 9.1 9.1 6.3 5.9  --   NEUTROABS  --   --  5.1 2.6 2.5  --   HGB 11.4* 12.6 12.1 11.8* 11.4* 12.9  HCT 35.7* 40.4 37.7 36.5 36.2 38.0  MCV 81.1 83.0 82.0 82.2 83.0  --  PLT 225 216 203 198 194  --      Basic Metabolic Panel: Recent Labs  Lab 01/22/21 1943 01/23/21 0413 01/24/21 0334 01/25/21 0356 01/26/21 0223 01/26/21 1719  NA 138 137 136 137 137 139  K 3.9 4.1 4.1 4.0 4.0 3.9  CL 108 105 102 103 102 97*  CO2 26 29 28 31 30   --   GLUCOSE 103* 100* 96 96 96 113*  BUN 13 11 8 9 7 6   CREATININE 0.99 0.93 0.85 0.92 0.80 0.90  CALCIUM 8.6* 8.6* 8.4* 8.5* 8.6*  --   MG  --  1.9 2.1 2.0 2.1  --      GFR: Estimated Creatinine Clearance: 87.9 mL/min (by C-G formula based on SCr of 0.9 mg/dL).  Liver Function Tests: Recent Labs  Lab 01/23/21 0413 01/24/21 0334 01/25/21 0356 01/26/21 0223  AST 12* 13* 16 21  ALT 11 12 15 25   ALKPHOS 8* 14* 10* 14*  BILITOT 0.7 0.6 0.6 0.5  PROT 4.4* 4.8* 4.5* 4.8*  ALBUMIN 3.7 3.5 3.6 3.6      CBG: No results for input(s): GLUCAP in the last 168 hours.   Recent Results (from the past 240 hour(s))  SARS CORONAVIRUS 2 (TAT 6-24 HRS) Nasopharyngeal Nasopharyngeal Swab     Status: None   Collection Time: 01/23/21  8:20 AM   Specimen: Nasopharyngeal Swab  Result Value Ref Range Status   SARS Coronavirus 2 NEGATIVE NEGATIVE Final    Comment: (NOTE) SARS-CoV-2 target nucleic acids are NOT DETECTED.  The SARS-CoV-2 RNA is generally detectable in upper and lower respiratory specimens during the acute phase of infection. Negative results do not preclude SARS-CoV-2 infection, do not rule out co-infections with other pathogens, and should not be used as the sole basis for treatment or other patient management decisions. Negative results must be combined with clinical observations, patient history, and epidemiological information. The expected result is Negative.  Fact Sheet for Patients: 03/28/21  Fact Sheet for Healthcare Providers:  This test is not yet approved or cleared by the 03/25/21 FDA and  has been authorized for detection and/or diagnosis of SARS-CoV-2 by FDA under an Emergency Use Authorization (EUA). This EUA will remain  in effect (meaning this test can be used) for the duration of the COVID-19 declaration under Se ction 564(b)(1) of the Act, 21 U.S.C. section 360bbb-3(b)(1), unless the authorization is terminated or revoked sooner.  Performed at [facility], [address] [place], [facility] [address]   Respiratory (~20 pathogens) panel by PCR     Status: None   Collection Time: 01/23/21  8:20 AM   Specimen: Nasopharyngeal Swab; Respiratory  Result Value Ref Range Status   Adenovirus NOT DETECTED NOT DETECTED Final   Coronavirus 229E NOT DETECTED NOT DETECTED Final    Comment: (NOTE) The Coronavirus on the Respiratory Panel, DOES NOT test for the novel   Coronavirus (2019 nCoV)    Coronavirus HKU1 NOT DETECTED NOT DETECTED Final   Coronavirus NL63 NOT DETECTED NOT DETECTED Final   Coronavirus OC43 NOT DETECTED NOT DETECTED Final   Metapneumovirus NOT DETECTED NOT DETECTED Final   Rhinovirus / Enterovirus NOT DETECTED NOT DETECTED Final   Influenza A NOT DETECTED NOT DETECTED Final   Influenza B NOT DETECTED NOT DETECTED Final   Parainfluenza Virus 1 NOT DETECTED NOT DETECTED Final   Parainfluenza Virus 2 NOT DETECTED NOT DETECTED Final   Parainfluenza Virus 3 NOT DETECTED NOT DETECTED Final   Parainfluenza  Virus 4 NOT DETECTED NOT DETECTED Final   Respiratory Syncytial Virus NOT DETECTED NOT DETECTED Final   Bordetella pertussis NOT DETECTED NOT DETECTED Final   Bordetella Parapertussis NOT DETECTED NOT DETECTED Final   Chlamydophila pneumoniae NOT DETECTED NOT DETECTED Final   Mycoplasma pneumoniae NOT DETECTED NOT DETECTED Final    Comment: Performed at [facility], [address] [place], [place] [address]  Culture, group A strep     Status: None   Collection Time: 01/24/21  3:30 PM   Specimen: Throat  Result Value Ref Range Status   Specimen Description THROAT  Final   Special Requests Normal  Final   Culture   Final    NO GROUP A STREP (S.PYOGENES) ISOLATED Performed at [facility], [address] [place], [place] [address]    Report Status 01/26/2021 FINAL  Final          Radiology Studies: No results found.      Scheduled Meds:  Chlorhexidine Gluconate Cloth  6 each Topical Daily   cholecalciferol  1,000 Units Oral Daily   enoxaparin (LOVENOX) injection  40 mg Subcutaneous Q24H   loratadine  10 mg Oral Daily   multivitamin with minerals  1 tablet Oral Daily   vitamin B-12  500 mcg Oral Daily   Continuous Infusions:  citrate dextrose       LOS: 10 days        Kathlen Mody, MD [facility]   To contact the attending provider between 7A-7P or the covering provider during  after hours 7P-7A, please log into the web site [email or web address] and access using universal [place] password for that web site. If you do not have the password, please call the hospital operator.  01/27/2021, 4:30 PM

## 2021-01-27 NOTE — Progress Notes (Signed)
Neurology Progress Note  S: Vision is the same as yesterday. Had twitching in chest for about 3 seconds yesterday while receiving PLEX, but her Calcium was found to be low. Asking for her PLEX to be done early in am with catheter removal, so she can go home tomorrow. Last PLEX tomorrow.   O: Current vital signs: BP 110/75 (BP Location: Left Arm)   Pulse 79   Temp 98.6 F (37 C) (Oral)   Resp 17   Ht 5\' 6"  (1.676 m)   Wt 66 kg   LMP 01/11/2021 (Approximate)   SpO2 100%   BMI 23.48 kg/m  Vital signs in last 24 hours: Temp:  [98 F (36.7 C)-98.8 F (37.1 C)] 98.6 F (37 C) (08/11 0800) Pulse Rate:  [66-102] 79 (08/11 0800) Resp:  [12-17] 17 (08/11 0800) BP: (89-136)/(62-82) 110/75 (08/11 0800) SpO2:  [99 %-100 %] 100 % (08/11 0800) Weight:  [66 kg] 66 kg (08/11 10-03-2002)  PE:  GENERAL: Very well appearing female. Awake, alert in NAD. Sitting up on couch in room working on her computer.  HEENT: Normocephalic and atraumatic.  LUNGS: Normal respiratory effort.  Ext: warm. PSYCH: light affect.    NEURO:  Mental Status: Alert and oriented.  Speech/Language: speech is without aphasia or dysarthria.  Naming, repetition, fluency, and comprehension intact.   Cranial Nerves:  II: PERRL. Visual fields full even in central vision OD.  III, IV, VI: EOMI. Eyelids elevate symmetrically.  V: Sensation is intact to light touch and symmetrical to face.  VII: Smile is symmetrical.   Medications  Current Facility-Administered Medications:    acetaminophen (TYLENOL) tablet 650 mg, 650 mg, Oral, Q4H PRN, 650 mg at 01/26/21 1707 **OR** acetaminophen (TYLENOL) suppository 650 mg, 650 mg, Rectal, Q4H PRN, 03/28/21, MD   acetaminophen (TYLENOL) tablet 650 mg, 650 mg, Oral, Q4H PRN, Erick Blinks, MD   Chlorhexidine Gluconate Cloth 2 % PADS 6 each, 6 each, Topical, Daily, Erick Blinks, MD, 6 each at 01/26/21 03/28/21   cholecalciferol (VITAMIN D3) tablet 1,000 Units, 1,000 Units,  Oral, Daily, 5366, MD, 1,000 Units at 01/26/21 0915   citrate dextrose (ACD-A anticoagulant) solution 1,000 mL, 1,000 mL, Other, Continuous, 03/28/21, Salman, MD, 1,000 mL at 01/26/21 1722   diphenhydrAMINE (BENADRYL) capsule 25 mg, 25 mg, Oral, Q6H PRN, 03/28/21, MD, 25 mg at 01/26/21 1706   enoxaparin (LOVENOX) injection 40 mg, 40 mg, Subcutaneous, Q24H, Rathore, Vasundhra, MD, 40 mg at 01/18/21 0959   lidocaine (XYLOCAINE) 1 % (with pres) injection, , , PRN, 03/20/21, DO, 10 mL at 01/21/21 0852   loratadine (CLARITIN) tablet 10 mg, 10 mg, Oral, Daily PRN, 03/23/21, MD, 10 mg at 01/17/21 2058   loratadine (CLARITIN) tablet 10 mg, 10 mg, Oral, Daily, 2059 K, MD, 10 mg at 01/26/21 0916   LORazepam (ATIVAN) tablet 0.5 mg, 0.5 mg, Oral, BID PRN, 03/28/21, MD, 0.5 mg at 01/18/21 2132   menthol-cetylpyridinium (CEPACOL) lozenge 3 mg, 1 lozenge, Oral, PRN, 2133, MD   multivitamin with minerals tablet 1 tablet, 1 tablet, Oral, Daily, Leroy Sea, MD, 1 tablet at 01/26/21 0915   vitamin B-12 (CYANOCOBALAMIN) tablet 500 mcg, 500 mcg, Oral, Daily, 03/28/21, MD, 500 mcg at 01/26/21 0915  Labs:  Ca++ 8.6  No new Imaging  Assessment:  30 yo female who presented with painful vision loss OD. Not enough improvement with steroids, so PLEX was started.Tomorrow is last day of PLEX. She  reports 90% vision improvement. Will need f/up with Dr. Epimenio Foot as MRI with evidence of findings consistent with MS.   Impression: -Optic neuritis.  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  Recommendations/Plan:  -Continue PLEX, 5th and final dose tomorrow 01/28/21.   -f/up with Dr. Epimenio Foot of [facility] due to demyelination of MRI.  -Out patient ophthalmology.  -If she has no reaction to PLEX tomorrow, she may leave from neurology stand point.   Secure chatted attending about leaving tomorrow and scheduling PLEX early with catheter removal.   Pt seen by Jimmye Norman, MSN, APN-BC/Nurse Practitioner/Neuro and later by MD. Note and plan to be edited as needed by MD.  Pager: [phone]

## 2021-01-27 NOTE — Plan of Care (Signed)
Patient understanding of plan of care, excited about hopefully being discharged tomorrow

## 2021-01-28 ENCOUNTER — Inpatient Hospital Stay (HOSPITAL_COMMUNITY): Payer: Self-pay

## 2021-01-28 ENCOUNTER — Other Ambulatory Visit (HOSPITAL_COMMUNITY): Payer: Self-pay

## 2021-01-28 HISTORY — PX: IR REMOVAL TUN CV CATH W/O FL: IMG2289

## 2021-01-28 MED ORDER — CALCIUM GLUCONATE-NACL 2-0.675 GM/100ML-% IV SOLN
INTRAVENOUS | Status: AC
  Administered 2021-01-28: 2000 mg via INTRAVENOUS
  Filled 2021-01-28: qty 100

## 2021-01-28 MED ORDER — ACETAMINOPHEN 325 MG PO TABS: 650.0000 mg | ORAL_TABLET | ORAL | Status: DC | PRN

## 2021-01-28 MED ORDER — CHLORHEXIDINE GLUCONATE 4 % EX LIQD
CUTANEOUS | Status: AC
  Filled 2021-01-28: qty 15

## 2021-01-28 MED ORDER — CALCIUM CARBONATE ANTACID 500 MG PO CHEW
CHEWABLE_TABLET | ORAL | Status: AC
  Administered 2021-01-28: 400 mg
  Filled 2021-01-28: qty 2

## 2021-01-28 MED ORDER — SODIUM CHLORIDE 0.9 % IV SOLN
INTRAVENOUS | Status: AC
  Filled 2021-01-28 (×3): qty 200

## 2021-01-28 MED ORDER — ACETAMINOPHEN 325 MG PO TABS
ORAL_TABLET | ORAL | Status: AC
  Administered 2021-01-28: 650 mg via ORAL
  Filled 2021-01-28: qty 2

## 2021-01-28 MED ORDER — CALCIUM CARBONATE ANTACID 500 MG PO CHEW
CHEWABLE_TABLET | ORAL | Status: AC
  Filled 2021-01-28: qty 2

## 2021-01-28 MED ORDER — CALCIUM GLUCONATE-NACL 2-0.675 GM/100ML-% IV SOLN
INTRAVENOUS | Status: AC
  Filled 2021-01-28: qty 100

## 2021-01-28 MED ORDER — ACD FORMULA A 0.73-2.45-2.2 GM/100ML VI SOLN
Status: AC
  Administered 2021-01-28: 1000 mL
  Filled 2021-01-28: qty 500

## 2021-01-28 MED ORDER — CALCIUM GLUCONATE-NACL 2-0.675 GM/100ML-% IV SOLN: 2.0000 g | Freq: Once | INTRAVENOUS | Status: AC

## 2021-01-28 MED ORDER — DIPHENHYDRAMINE HCL 25 MG PO CAPS
ORAL_CAPSULE | ORAL | Status: AC
  Administered 2021-01-28: 25 mg via ORAL
  Filled 2021-01-28: qty 1

## 2021-01-28 MED ORDER — HEPARIN SODIUM (PORCINE) 1000 UNIT/ML IJ SOLN: 1000.0000 [IU] | Freq: Once | INTRAMUSCULAR | Status: DC

## 2021-01-28 MED ORDER — ADULT MULTIVITAMIN W/MINERALS CH: 1.0000 | ORAL_TABLET | Freq: Every day | ORAL | Status: AC

## 2021-01-28 MED ORDER — VITAMIN D3 25 MCG PO TABS: 1000.0000 [IU] | ORAL_TABLET | Freq: Every day | ORAL | Status: AC

## 2021-01-28 MED ORDER — HEPARIN SODIUM (PORCINE) 1000 UNIT/ML IJ SOLN
INTRAMUSCULAR | Status: AC
  Filled 2021-01-28: qty 3

## 2021-01-28 MED ORDER — CYANOCOBALAMIN 500 MCG PO TABS: 500.0000 ug | ORAL_TABLET | Freq: Every day | ORAL | Status: AC

## 2021-01-28 NOTE — Progress Notes (Signed)
RN gave pt discharge instructions and she stated understanding, IV has been removed and IR removed her cath and dressing placed. Cath removal area is clean and dry, pt packing all belongings medications OTC and will pick up at DC

## 2021-01-28 NOTE — Progress Notes (Signed)
Pt completed TPE treatment without complications. Pt alert and  vitals are stable.

## 2021-01-28 NOTE — Progress Notes (Signed)
Pt has requested that staff not enter her room for vitals and lab work until [employer].

## 2021-01-28 NOTE — Discharge Summary (Signed)
Physician Discharge Summary  Danielle York EGB:[id number] DOB: 12/11/1990 DOA: 01/16/2021  PCP: Merryl Hacker, No  Admit date: 01/16/2021 Discharge date: 01/28/2021  Admitted From: Home.  Disposition:  Home.   Recommendations for Outpatient Follow-up:  Follow up with PCP in 1-2 weeks Please obtain BMP/CBC in one week Please follow up with Dr. Felecia Shelling as MRI with evidence of findings consistent with MS.   Discharge Condition:stable.  CODE STATUS:full code  Diet recommendation: Heart Healthy   Brief/Interim Summary: Danielle York is a 30 y.o. 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 Discharge Diagnoses:  Principal Problem:   Optic neuritis   Acute right eye pain with vision loss probably secondary to optic neuritis.  Neurology consulted and recommended IV solumedrol course which has completed.  PLEX treatments started with improvement in the vision and finished on 01/28/21.  Vision has improved. She is back to baseline.  Will need outpatient follow upwith Dr Felecia Shelling of [facility] due to demyelination of the MRI.      Mild sore throat and fatigue:  Resolved.  Monospot is negative.  Group a strep is negative.      Vitamin d deficiency:  Replacement ordered.        Discharge Instructions  Discharge Instructions     Ambulatory referral to Dentistry   Complete by: As directed    Diet - low sodium heart healthy   Complete by: As directed    No wound care   Complete by: As directed       Allergies as of 01/28/2021       Reactions   Amoxicillin         Medication List     TAKE these medications    multivitamin with minerals Tabs tablet Take 1 tablet by mouth daily. Start taking on: January 29, 2021   vitamin B-12 500 MCG tablet Commonly known as:  CYANOCOBALAMIN Take 1 tablet (500 mcg total) by mouth daily. Start taking on: January 29, 2021   Vitamin D3 25 MCG tablet Commonly known as: Vitamin D Take 1 tablet (1,000 Units total) by mouth daily. Start taking on: January 29, 2021        Follow-up Information     Sater, Nanine Means, MD. Schedule an appointment as soon as possible for a visit in 4 week(s).   Specialty: Neurology Contact information: [place] [id number] [phone]                Allergies  Allergen Reactions   Amoxicillin     Consultations: Neurology IR   Procedures/Studies: DG Orthopantogram  Result Date: 01/25/2021 CLINICAL DATA:  Facial abscess.  Right-sided jaw pain. EXAM: ORTHOPANTOGRAM/PANORAMIC COMPARISON:  None. FINDINGS: There are prominent dental caries involving first maxillary molar teeth on both the right and the left. Multiple dental fillings and previous root canal procedures are noted. No definite periapical erosion is identified. IMPRESSION: Dental caries without a periapical abscess identified. Electronically Signed   By: Logan Bores M.D.   On: 01/25/2021 12:48   MR BRAIN W CONTRAST  Result Date: 01/17/2021 CLINICAL DATA:  Vision loss, monocular. EXAM: MRI HEAD AND ORBITS WITH CONTRAST TECHNIQUE: Multiplanar, multiecho pulse sequences of the brain and surrounding structures were obtained with intravenous contrast. Multiplanar, multiecho pulse sequences of the orbits and surrounding structures were  obtained including fat saturation techniques, after intravenous contrast administration. CONTRAST:  45m GADAVIST GADOBUTROL 1 MMOL/ML IV SOLN COMPARISON:  Same day MRI head/orbits. FINDINGS: Postcontrast imaging was performed to further evaluate findings on same day MRI due to poor contrast bolus timing on that study. There is asymmetric enhancement of the prechiasmatic, intracanalicular and intraorbital right optic nerve. The optic chiasm and left optic nerves do not appear  to enhance. There also appears to be mild enhancement of the optic nerve sheath in these regions. No masslike enhancement. As mentioned on same day MRI, the right optic nerve is mildly enlarged diffusely relative to the left. Surrounding intra orbital fat appears somewhat fuzzy on the right. Question faint enhancement of a small right periventricular lesion (series 7, image 9). Otherwise, no abnormal enhancement outside of the orbits/optic nerves. IMPRESSION: 1. Postcontrast imaging of the head and orbits was performed to further evaluate finding seen on same day MRI. There is asymmetric enhancement and mild enlargement of the prechiasmatic, intracanalicular, and intraorbital right optic nerve, favored to represent optic neuritis from demyelination given white matter lesions suggestive of demyelination on the same day MRI. Idiopathic perineuritis (pseudotumor) or infectious optic neuritis are thought less likely. 2. Question faint enhancement of a small right periventricular lesion (series 7, image 9), which may represent an area of active demyelination. Otherwise, no abnormal enhancement outside of the orbits/optic nerves. Electronically Signed   By: FMargaretha SheffieldMD   On: 01/17/2021 07:43   MR Brain W and Wo Contrast  Result Date: 01/17/2021 CLINICAL DATA:  Initial evaluation for acute right-sided visual disturbance, suspected optic neuritis. EXAM: MRI HEAD AND ORBITS WITHOUT AND WITH CONTRAST TECHNIQUE: Multiplanar, multiecho pulse sequences of the brain and surrounding structures were obtained without and with intravenous contrast. Multiplanar, multiecho pulse sequences of the orbits and surrounding structures were obtained including fat saturation techniques, before and after intravenous contrast administration. CONTRAST:  544mGADAVIST GADOBUTROL 1 MMOL/ML IV SOLN COMPARISON:  None available. FINDINGS: MRI HEAD FINDINGS Brain: Examination is somewhat technically limited as while the patient did receive  IV contrast material for this exam, little to no contrast material is seen within the brain on postcontrast sequences, limiting assessment. Cerebral volume within normal limits. There are scattered patchy T2/FLAIR hyperintensities involving the periventricular and deep white matter of both cerebral hemispheres. A few probable scattered subtle juxtacortical lesions noted as well. Several of these foci radiate from the lateral ventricles in a perpendicular fashion. A few infratentorial lesions involving the left dorsal midbrain and right pons noted (series 12, images 9, 7). For reference purposes, the largest discrete lesion seen at the deep white matter of the right frontal centrum semi ovale and measures 1 cm (series 12, image 15). Multiple corresponding T1 black holes. Findings are most consistent with demyelinating disease/multiple sclerosis. Several of these lesions demonstrate associated T2 shine through, with no definite restricted diffusion to suggest active demyelination. Again, evaluation for associated enhancement is fairly limited on this exam as little to no contrast material is on board for postcontrast sequences. No evidence for acute or subacute infarct. Gray-white matter differentiation maintained. No encephalomalacia to suggest chronic cortical infarction. No foci of susceptibility artifact to suggest acute or chronic intracranial hemorrhage. No mass lesion, midline shift or mass effect. No hydrocephalus or extra-axial fluid collection. Pituitary gland suprasellar region normal. Midline structures intact. Vascular: Major intracranial vascular flow voids are maintained. Skull and upper cervical spine: Craniocervical junction within normal limits. Bone marrow signal intensity normal. No scalp soft tissue  abnormality. Other: No mastoid effusion.  Inner ear structures grossly normal. MRI ORBITS FINDINGS Orbits: Globes are symmetric in size with normal morphology bilaterally. While evaluation for possible  optic neuritis is limited given the relative lack of IV contrast on this exam, there is subtle asymmetric enlargement of the right optic nerve as compared to the left. Additionally, the nerve and/or nerve sheath itself appears somewhat fuzzy and irregular as compared to the contralateral left nerve (series 2, image 8). Findings also seen on corresponding coronal sequence (series 5, image 18). Given the patient history and corresponding findings in the brain, findings are highly suspicious for acute optic neuritis. No abnormality seen about the orbital apices. Optic chiasm normally situated within the suprasellar cistern without abnormality. No sellar or suprasellar mass. Extra-ocular muscles symmetric and within normal limits. Lacrimal glands normal. Superior orbital veins symmetric and within normal limits. No abnormality about the cavernous sinus. Visualized sinuses: Visualized paranasal sinuses are clear. Soft tissues: Unremarkable. IMPRESSION: 1. Technically limited exam as while contrast was administered for this study, little to no contrast is seen onboard on postcontrast sequences, somewhat limiting assessment. 2. 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 No definite evidence for active demyelination on this limited study. 3. Subtle asymmetric enlargement and irregularity of the right optic nerve as above, highly suspicious for acute optic neuritis, particularly given the patient history and concomitant findings within the brain. Electronically Signed   By: Jeannine Boga M.D.   On: 01/17/2021 03:28   IR US Guide Vasc Access Right  Result Date: 01/21/2021 INDICATION: 30 year old female referred for temporary plasmapheresis catheter EXAM: ULTRASOUND-GUIDED ACCESS RIGHT IJ TEMPORARY HEMODIALYSIS CATHETER PLACEMENT MEDICATIONS: None ANESTHESIA/SEDATION: None FLUOROSCOPY  TIME:  Fluoroscopy Time: 0 minutes 12 seconds (3 mGy). COMPLICATIONS: None PROCEDURE: Informed written consent was obtained from the patient's family after a discussion of the risks, benefits, and alternatives to treatment. Questions regarding the procedure were encouraged and answered. The right neck was prepped with chlorhexidine in a sterile fashion, and a sterile drape was applied covering the operative field. Maximum barrier sterile technique with sterile gowns and gloves were used for the procedure. A timeout was performed prior to the initiation of the procedure. A micropuncture kit was utilized to access the right internal jugular vein under direct, real-time ultrasound guidance after the overlying soft tissues were anesthetized with 1% lidocaine with epinephrine. Ultrasound image documentation was performed. The microwire was kinked to measure appropriate catheter length. A stiff glidewire was advanced to the level of the IVC. A 15 cm hemodialysis catheter was then placed over the wire. Final catheter positioning was confirmed and documented with a spot radiographic image. The catheter aspirates and flushes normally. The catheter was flushed with appropriate volume heparin dwells. Dressings were applied. The patient tolerated the procedure well without immediate post procedural complication. IMPRESSION: Status post image guided right IJ temporary hemodialysis catheter for plasmapheresis. Signed, Dulcy Fanny. Dellia Nims, RPVI [facility] Electronically Signed   By: Corrie Mckusick D.O.   On: 01/21/2021 09:19   DG Chest Port 1 View  Result Date: 01/23/2021 CLINICAL DATA:  Shortness of breath. EXAM: PORTABLE CHEST 1 VIEW COMPARISON:  01/17/2021 FINDINGS: The cardiomediastinal silhouette is unremarkable. A RIGHT IJ central venous catheter is noted with tip overlying the LOWER SVC. There is no evidence of focal airspace disease, pulmonary edema, suspicious  pulmonary nodule/mass, pleural effusion, or pneumothorax. No acute bony abnormalities  are identified. IMPRESSION: No active disease. Electronically Signed   By: Margarette Canada M.D.   On: 01/23/2021 09:52   DG Chest Port 1 View  Result Date: 01/17/2021 CLINICAL DATA:  Optic neuritis EXAM: PORTABLE CHEST 1 VIEW COMPARISON:  None. FINDINGS: The heart size and mediastinal contours are within normal limits. No evidence of hilar or mediastinal adenopathy. Both lungs are clear. Right cervical rib. Artifact from EKG leads. IMPRESSION: Negative chest. Electronically Signed   By: Monte Fantasia M.D.   On: 01/17/2021 04:33   MR ORBITS W CONTRAST  Result Date: 01/17/2021 CLINICAL DATA:  Vision loss, monocular. EXAM: MRI HEAD AND ORBITS WITH CONTRAST TECHNIQUE: Multiplanar, multiecho pulse sequences of the brain and surrounding structures were obtained with intravenous contrast. Multiplanar, multiecho pulse sequences of the orbits and surrounding structures were obtained including fat saturation techniques, after intravenous contrast administration. CONTRAST:  26m GADAVIST GADOBUTROL 1 MMOL/ML IV SOLN COMPARISON:  Same day MRI head/orbits. FINDINGS: Postcontrast imaging was performed to further evaluate findings on same day MRI due to poor contrast bolus timing on that study. There is asymmetric enhancement of the prechiasmatic, intracanalicular and intraorbital right optic nerve. The optic chiasm and left optic nerves do not appear to enhance. There also appears to be mild enhancement of the optic nerve sheath in these regions. No masslike enhancement. As mentioned on same day MRI, the right optic nerve is mildly enlarged diffusely relative to the left. Surrounding intra orbital fat appears somewhat fuzzy on the right. Question faint enhancement of a small right periventricular lesion (series 7, image 9). Otherwise, no abnormal enhancement outside of the orbits/optic nerves. IMPRESSION: 1. Postcontrast imaging of the head  and orbits was performed to further evaluate finding seen on same day MRI. There is asymmetric enhancement and mild enlargement of the prechiasmatic, intracanalicular, and intraorbital right optic nerve, favored to represent optic neuritis from demyelination given white matter lesions suggestive of demyelination on the same day MRI. Idiopathic perineuritis (pseudotumor) or infectious optic neuritis are thought less likely. 2. Question faint enhancement of a small right periventricular lesion (series 7, image 9), which may represent an area of active demyelination. Otherwise, no abnormal enhancement outside of the orbits/optic nerves. Electronically Signed   By: FMargaretha SheffieldMD   On: 01/17/2021 07:43   MR ORBITS W WO CONTRAST  Result Date: 01/17/2021 CLINICAL DATA:  Initial evaluation for acute right-sided visual disturbance, suspected optic neuritis. EXAM: MRI HEAD AND ORBITS WITHOUT AND WITH CONTRAST TECHNIQUE: Multiplanar, multiecho pulse sequences of the brain and surrounding structures were obtained without and with intravenous contrast. Multiplanar, multiecho pulse sequences of the orbits and surrounding structures were obtained including fat saturation techniques, before and after intravenous contrast administration. CONTRAST:  553mGADAVIST GADOBUTROL 1 MMOL/ML IV SOLN COMPARISON:  None available. FINDINGS: MRI HEAD FINDINGS Brain: Examination is somewhat technically limited as while the patient did receive IV contrast material for this exam, little to no contrast material is seen within the brain on postcontrast sequences, limiting assessment. Cerebral volume within normal limits. There are scattered patchy T2/FLAIR hyperintensities involving the periventricular and deep white matter of both cerebral hemispheres. A few probable scattered subtle juxtacortical lesions noted as well. Several of these foci radiate from the lateral ventricles in a perpendicular fashion. A few infratentorial lesions  involving the left dorsal midbrain and right pons noted (series 12, images 9, 7). For reference purposes, the largest discrete lesion seen at the deep white matter of the right frontal centrum semi ovale and measures  1 cm (series 12, image 15). Multiple corresponding T1 black holes. Findings are most consistent with demyelinating disease/multiple sclerosis. Several of these lesions demonstrate associated T2 shine through, with no definite restricted diffusion to suggest active demyelination. Again, evaluation for associated enhancement is fairly limited on this exam as little to no contrast material is on board for postcontrast sequences. No evidence for acute or subacute infarct. Gray-white matter differentiation maintained. No encephalomalacia to suggest chronic cortical infarction. No foci of susceptibility artifact to suggest acute or chronic intracranial hemorrhage. No mass lesion, midline shift or mass effect. No hydrocephalus or extra-axial fluid collection. Pituitary gland suprasellar region normal. Midline structures intact. Vascular: Major intracranial vascular flow voids are maintained. Skull and upper cervical spine: Craniocervical junction within normal limits. Bone marrow signal intensity normal. No scalp soft tissue abnormality. Other: No mastoid effusion.  Inner ear structures grossly normal. MRI ORBITS FINDINGS Orbits: Globes are symmetric in size with normal morphology bilaterally. While evaluation for possible optic neuritis is limited given the relative lack of IV contrast on this exam, there is subtle asymmetric enlargement of the right optic nerve as compared to the left. Additionally, the nerve and/or nerve sheath itself appears somewhat fuzzy and irregular as compared to the contralateral left nerve (series 2, image 8). Findings also seen on corresponding coronal sequence (series 5, image 18). Given the patient history and corresponding findings in the brain, findings are highly suspicious  for acute optic neuritis. No abnormality seen about the orbital apices. Optic chiasm normally situated within the suprasellar cistern without abnormality. No sellar or suprasellar mass. Extra-ocular muscles symmetric and within normal limits. Lacrimal glands normal. Superior orbital veins symmetric and within normal limits. No abnormality about the cavernous sinus. Visualized sinuses: Visualized paranasal sinuses are clear. Soft tissues: Unremarkable. IMPRESSION: 1. Technically limited exam as while contrast was administered for this study, little to no contrast is seen onboard on postcontrast sequences, somewhat limiting assessment. 2. 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 No definite evidence for active demyelination on this limited study. 3. Subtle asymmetric enlargement and irregularity of the right optic nerve as above, highly suspicious for acute optic neuritis, particularly given the patient history and concomitant findings within the brain. Electronically Signed   By: Jeannine Boga M.D.   On: 01/17/2021 03:28      Subjective: No new complaints.   Discharge Exam: Vitals:   01/28/21 1057 01/28/21 1110  BP: 102/70 103/70  Pulse: 84 86  Resp:    Temp: 98.8 F (37.1 C) 98.8 F (37.1 C)  SpO2: 100% 100%   Vitals:   01/28/21 1030 01/28/21 1045 01/28/21 1057 01/28/21 1110  BP: 107/70 105/70 102/70 103/70  Pulse: 84 84 84 86  Resp: 16     Temp: 98.8 F (37.1 C) 98.8 F (37.1 C) 98.8 F (37.1 C) 98.8 F (37.1 C)  TempSrc:    Oral  SpO2: 100% 100% 100% 100%  Weight:    70 kg  Height:        General: Pt is alert, awake, not in acute distress Cardiovascular: RRR, S1/S2 +, no rubs, no gallops Respiratory: CTA bilaterally, no wheezing, no rhonchi Abdominal: Soft, NT, ND, bowel sounds + Extremities: no edema, no cyanosis    The results of  significant diagnostics from this hospitalization (including imaging, microbiology, ancillary and laboratory) are listed below for reference.     Microbiology: Recent Results (from the past 240 hour(s))  SARS CORONAVIRUS 2 (TAT 6-24 HRS) Nasopharyngeal Nasopharyngeal Swab     Status: None   Collection Time: 01/23/21  8:20 AM   Specimen: Nasopharyngeal Swab  Result Value Ref Range Status   SARS Coronavirus 2 NEGATIVE NEGATIVE Final    Comment: (NOTE) SARS-CoV-2 target nucleic acids are NOT DETECTED.  The SARS-CoV-2 RNA is generally detectable in upper and lower respiratory specimens during the acute phase of infection. Negative results do not preclude SARS-CoV-2 infection, do not rule out co-infections with other pathogens, and should not be used as the sole basis for treatment or other patient management decisions. Negative results must be combined with clinical observations, patient history, and epidemiological information. The expected result is Negative.  Fact Sheet for Patients: SugarRoll.be  Fact Sheet for Healthcare Providers: https://www.woods-mathews.com/  This test is not yet approved or cleared by the [place] FDA and  has been authorized for detection and/or diagnosis of SARS-CoV-2 by FDA under an Emergency Use Authorization (EUA). This EUA will remain  in effect (meaning this test can be used) for the duration of the COVID-19 declaration under Se ction 564(b)(1) of the Act, 21 U.S.C. section 360bbb-3(b)(1), unless the authorization is terminated or revoked sooner.  Performed at [facility], [place] [address] [place], [place] [address]   Respiratory (~20 pathogens) panel by PCR     Status: None   Collection Time: 01/23/21  8:20 AM   Specimen: Nasopharyngeal Swab; Respiratory  Result Value Ref Range Status   Adenovirus NOT DETECTED NOT DETECTED Final   Coronavirus 229E NOT DETECTED NOT DETECTED Final    Comment:  (NOTE) The Coronavirus on the Respiratory Panel, DOES NOT test for the novel  Coronavirus (2019 nCoV)    Coronavirus HKU1 NOT DETECTED NOT DETECTED Final   Coronavirus NL63 NOT DETECTED NOT DETECTED Final   Coronavirus OC43 NOT DETECTED NOT DETECTED Final   Metapneumovirus NOT DETECTED NOT DETECTED Final   Rhinovirus / Enterovirus NOT DETECTED NOT DETECTED Final   Influenza A NOT DETECTED NOT DETECTED Final   Influenza B NOT DETECTED NOT DETECTED Final   Parainfluenza Virus 1 NOT DETECTED NOT DETECTED Final   Parainfluenza Virus 2 NOT DETECTED NOT DETECTED Final   Parainfluenza Virus 3 NOT DETECTED NOT DETECTED Final   Parainfluenza Virus 4 NOT DETECTED NOT DETECTED Final   Respiratory Syncytial Virus NOT DETECTED NOT DETECTED Final   Bordetella pertussis NOT DETECTED NOT DETECTED Final   Bordetella Parapertussis NOT DETECTED NOT DETECTED Final   Chlamydophila pneumoniae NOT DETECTED NOT DETECTED Final   Mycoplasma pneumoniae NOT DETECTED NOT DETECTED Final    Comment: Performed at [facility], [place]. [address], [place], [place] [address]  Culture, group A strep     Status: None   Collection Time: 01/24/21  3:30 PM   Specimen: Throat  Result Value Ref Range Status   Specimen Description THROAT  Final   Special Requests Normal  Final   Culture   Final    NO GROUP A STREP (S.PYOGENES) ISOLATED Performed at [facility], [place] [address], [place], [place] [address]    Report Status 01/26/2021 FINAL  Final     Labs: BNP (last 3 results) No results for input(s): BNP in the last 8760 hours. Basic Metabolic Panel: Recent Labs  Lab 01/22/21 1943 01/23/21 0413 01/24/21 0334 01/25/21 0356 01/26/21 0223 01/26/21 1719  NA 138 137 136 137 137 139  K 3.9 4.1 4.1 4.0 4.0 3.9  CL 108 105 102  103 102 97*  CO2 26 29 28 31 30   --   GLUCOSE 103* 100* 96 96 96 113*  BUN 13 11 8 9 7 6   CREATININE 0.99 0.93 0.85 0.92 0.80 0.90  CALCIUM 8.6* 8.6* 8.4* 8.5* 8.6*  --   MG  --   1.9 2.1 2.0 2.1  --    Liver Function Tests: Recent Labs  Lab 01/23/21 0413 01/24/21 0334 01/25/21 0356 01/26/21 0223  AST 12* 13* 16 21  ALT 11 12 15 25   ALKPHOS 8* 14* 10* 14*  BILITOT 0.7 0.6 0.6 0.5  PROT 4.4* 4.8* 4.5* 4.8*  ALBUMIN 3.7 3.5 3.6 3.6   No results for input(s): LIPASE, AMYLASE in the last 168 hours. No results for input(s): AMMONIA in the last 168 hours. CBC: Recent Labs  Lab 01/23/21 0413 01/24/21 0334 01/25/21 0356 01/26/21 0223 01/26/21 1719  WBC 9.1 9.1 6.3 5.9  --   NEUTROABS  --  5.1 2.6 2.5  --   HGB 12.6 12.1 11.8* 11.4* 12.9  HCT 40.4 37.7 36.5 36.2 38.0  MCV 83.0 82.0 82.2 83.0  --   PLT 216 203 198 194  --    Cardiac Enzymes: No results for input(s): CKTOTAL, CKMB, CKMBINDEX, TROPONINI in the last 168 hours. BNP: Invalid input(s): POCBNP CBG: No results for input(s): GLUCAP in the last 168 hours. D-Dimer No results for input(s): DDIMER in the last 72 hours. Hgb A1c No results for input(s): HGBA1C in the last 72 hours. Lipid Profile No results for input(s): CHOL, HDL, LDLCALC, TRIG, CHOLHDL, LDLDIRECT in the last 72 hours. Thyroid function studies No results for input(s): TSH, T4TOTAL, T3FREE, THYROIDAB in the last 72 hours.  Invalid input(s): FREET3 Anemia work up No results for input(s): VITAMINB12, FOLATE, FERRITIN, TIBC, IRON, RETICCTPCT in the last 72 hours. Urinalysis No results found for: COLORURINE, APPEARANCEUR, [place], [place], [place], [place], [place], [place], PROTEINUR, UROBILINOGEN, NITRITE, LEUKOCYTESUR Sepsis Labs Invalid input(s): PROCALCITONIN,  WBC,  LACTICIDVEN Microbiology Recent Results (from the past 240 hour(s))  SARS CORONAVIRUS 2 (TAT 6-24 HRS) Nasopharyngeal Nasopharyngeal Swab     Status: None   Collection Time: 01/23/21  8:20 AM   Specimen: Nasopharyngeal Swab  Result Value Ref Range Status   SARS Coronavirus 2 NEGATIVE NEGATIVE Final    Comment: (NOTE) SARS-CoV-2 target nucleic acids are  NOT DETECTED.  The SARS-CoV-2 RNA is generally detectable in upper and lower respiratory specimens during the acute phase of infection. Negative results do not preclude SARS-CoV-2 infection, do not rule out co-infections with other pathogens, and should not be used as the sole basis for treatment or other patient management decisions. Negative results must be combined with clinical observations, patient history, and epidemiological information. The expected result is Negative.  Fact Sheet for Patients: SugarRoll.be  Fact Sheet for Healthcare Providers: https://www.woods-mathews.com/  This test is not yet approved or cleared by the [place] FDA and  has been authorized for detection and/or diagnosis of SARS-CoV-2 by FDA under an Emergency Use Authorization (EUA). This EUA will remain  in effect (meaning this test can be used) for the duration of the COVID-19 declaration under Se ction 564(b)(1) of the Act, 21 U.S.C. section 360bbb-3(b)(1), unless the authorization is terminated or revoked sooner.  Performed at [facility], [place] [address], [place], [place] [address]   Respiratory (~20 pathogens) panel by PCR     Status: None   Collection Time: 01/23/21  8:20 AM   Specimen: Nasopharyngeal Swab; Respiratory  Result  Value Ref Range Status   Adenovirus NOT DETECTED NOT DETECTED Final   Coronavirus 229E NOT DETECTED NOT DETECTED Final    Comment: (NOTE) The Coronavirus on the Respiratory Panel, DOES NOT test for the novel  Coronavirus (2019 nCoV)    Coronavirus HKU1 NOT DETECTED NOT DETECTED Final   Coronavirus NL63 NOT DETECTED NOT DETECTED Final   Coronavirus OC43 NOT DETECTED NOT DETECTED Final   Metapneumovirus NOT DETECTED NOT DETECTED Final   Rhinovirus / Enterovirus NOT DETECTED NOT DETECTED Final   Influenza A NOT DETECTED NOT DETECTED Final   Influenza B NOT DETECTED NOT DETECTED Final   Parainfluenza Virus 1 NOT  DETECTED NOT DETECTED Final   Parainfluenza Virus 2 NOT DETECTED NOT DETECTED Final   Parainfluenza Virus 3 NOT DETECTED NOT DETECTED Final   Parainfluenza Virus 4 NOT DETECTED NOT DETECTED Final   Respiratory Syncytial Virus NOT DETECTED NOT DETECTED Final   Bordetella pertussis NOT DETECTED NOT DETECTED Final   Bordetella Parapertussis NOT DETECTED NOT DETECTED Final   Chlamydophila pneumoniae NOT DETECTED NOT DETECTED Final   Mycoplasma pneumoniae NOT DETECTED NOT DETECTED Final    Comment: Performed at [facility], [place] [address] [place], [place] [address]  Culture, group A strep     Status: None   Collection Time: 01/24/21  3:30 PM   Specimen: Throat  Result Value Ref Range Status   Specimen Description THROAT  Final   Special Requests Normal  Final   Culture   Final    NO GROUP A STREP (S.PYOGENES) ISOLATED Performed at [facility], [place] [address] [place], [place] [address]    Report Status 01/26/2021 FINAL  Final     Time coordinating discharge: 36 minutes.   SIGNED:   Hosie Poisson, MD  [facility] 01/28/2021, 5:39 PM

## 2021-01-28 NOTE — Progress Notes (Addendum)
AM lab draw: arrived to room, informed by RN that pt does not want to be awakened before 0600. Will reschedule DBIV consult.

## 2021-01-28 NOTE — Progress Notes (Signed)
Pt woke up before 6am, tech went in to obtain her vitals. This RN went in to inform pt that her blood work would be collected after 6am as she requested. Pt now states that she does not want to have her blood drawn and is refusing lab work. IV team made aware.

## 2021-01-28 NOTE — Progress Notes (Signed)
Neurology Progress Note  S: no new changes reported. Vision much improved nearly back to baseline.  O: Current vital signs: BP 103/70   Pulse 86   Temp 98.8 F (37.1 C) (Oral)   Resp 16   Ht 5\' 6"  (1.676 m)   Wt 70 kg   LMP 01/11/2021 (Approximate)   SpO2 100%   BMI 24.91 kg/m  Vital signs in last 24 hours: Temp:  [98 F (36.7 C)-99.8 F (37.7 C)] 98.8 F (37.1 C) (08/12 1110) Pulse Rate:  [70-90] 86 (08/12 1110) Resp:  [14-18] 16 (08/12 1030) BP: (101-114)/(68-80) 103/70 (08/12 1110) SpO2:  [98 %-100 %] 100 % (08/12 1110) Weight:  [66.6 kg-70 kg] 70 kg (08/12 1110)  PE:  GENERAL: Very well appearing female. Awake, alert in NAD. Sitting up on couch in room working on her computer.  HEENT: Normocephalic and atraumatic.  LUNGS: Normal respiratory effort.  Ext: warm. PSYCH: light affect.    NEURO:  Mental Status: Alert and oriented.  Speech/Language: speech is without aphasia or dysarthria.  Naming, repetition, fluency, and comprehension intact.   Cranial Nerves:  II: PERRL. Visual fields full even in central vision OD.  III, IV, VI: EOMI. Eyelids elevate symmetrically.  V: Sensation is intact to light touch and symmetrical to face.  VII: Smile is symmetrical.  Unchanged from yesterday  Medications  Current Facility-Administered Medications:    calcium carbonate (TUMS - dosed in mg elemental calcium) 500 MG chewable tablet, , , ,    acetaminophen (TYLENOL) tablet 650 mg, 650 mg, Oral, Q4H PRN, 650 mg at 01/26/21 1707 **OR** acetaminophen (TYLENOL) suppository 650 mg, 650 mg, Rectal, Q4H PRN, 03/28/21, MD   acetaminophen (TYLENOL) tablet 650 mg, 650 mg, Oral, Q4H PRN, Erick Blinks, MD   acetaminophen (TYLENOL) tablet 650 mg, 650 mg, Oral, Q4H PRN, Erick Blinks, MD, 650 mg at 01/28/21 0935   calcium gluconate 2-0.675 GM/100ML-% IVPB, , , ,    Chlorhexidine Gluconate Cloth 2 % PADS 6 each, 6 each, Topical, Daily, [birth year]1989, MD, 6 each at  01/28/21 03/30/21   cholecalciferol (VITAMIN D3) tablet 1,000 Units, 1,000 Units, Oral, Daily, 5852, MD, 1,000 Units at 01/28/21 0917   citrate dextrose (ACD-A anticoagulant) solution 1,000 mL, 1,000 mL, Other, Continuous, 03/30/21, Derry Lory, MD, 1,000 mL at 01/28/21 0950   diphenhydrAMINE (BENADRYL) capsule 25 mg, 25 mg, Oral, Q6H PRN, 03/30/21, MD, 25 mg at 01/28/21 0935   enoxaparin (LOVENOX) injection 40 mg, 40 mg, Subcutaneous, Q24H, 03/30/21, MD, 40 mg at 01/18/21 0959   heparin sodium (porcine) 1000 UNIT/ML injection, , , ,    heparin sodium (porcine) injection 1,000 Units, 1,000 Units, Intracatheter, Once, 03/20/21, MD   lidocaine (XYLOCAINE) 1 % (with pres) injection, , , PRN, Erick Blinks, DO, 10 mL at 01/21/21 0852   loratadine (CLARITIN) tablet 10 mg, 10 mg, Oral, Daily PRN, 03/23/21, MD, 10 mg at 01/17/21 2058   loratadine (CLARITIN) tablet 10 mg, 10 mg, Oral, Daily, 2059 K, MD, 10 mg at 01/28/21 03/30/21   LORazepam (ATIVAN) tablet 0.5 mg, 0.5 mg, Oral, BID PRN, 7782, MD, 0.5 mg at 01/18/21 2132   menthol-cetylpyridinium (CEPACOL) lozenge 3 mg, 1 lozenge, Oral, PRN, 2133, MD   multivitamin with minerals tablet 1 tablet, 1 tablet, Oral, Daily, Leroy Sea, MD, 1 tablet at 01/28/21 03/30/21   vitamin B-12 (CYANOCOBALAMIN) tablet 500 mcg, 500 mcg, Oral, Daily, 4235, MD, 500 mcg at  01/28/21 0917  Labs:  Ca++ 8.6  No new Imaging  Assessment:  30 yo female who presented with painful vision loss OD. Not enough improvement with steroids, so PLEX was started. Vision nearly back to [place]as[place].  Will need f/up with Dr. Epimenio Foot as MRI with evidence of findings consistent with MS.   Impression: -Optic neuritis.  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  Recommendations/Plan:  Complete last Plex today. Remove catheter after Plex-IR aware Can be discharged home with follow-up with Dr. Despina Arias, at [facility]. Plan relayed to the primary team attending Dr. Blake Divine   -- Milon Dikes, MD Neurologist [facility] Pager: [phone]

## 2021-02-28 ENCOUNTER — Encounter: Payer: Self-pay | Admitting: Neurology

## 2021-02-28 ENCOUNTER — Ambulatory Visit (INDEPENDENT_AMBULATORY_CARE_PROVIDER_SITE_OTHER): Payer: Self-pay | Admitting: Neurology

## 2021-02-28 ENCOUNTER — Telehealth: Payer: Self-pay | Admitting: *Deleted

## 2021-02-28 ENCOUNTER — Other Ambulatory Visit: Payer: Self-pay

## 2021-02-28 VITALS — BP 115/79 | HR 86 | Ht 64.0 in | Wt 154.0 lb

## 2021-02-28 DIAGNOSIS — H469 Unspecified optic neuritis: Secondary | ICD-10-CM

## 2021-02-28 DIAGNOSIS — Z79899 Other long term (current) drug therapy: Secondary | ICD-10-CM

## 2021-02-28 DIAGNOSIS — G35 Multiple sclerosis: Secondary | ICD-10-CM

## 2021-02-28 NOTE — Telephone Encounter (Signed)
Placed JCV antibody specimen out in the [facility] box for pick up.  

## 2021-02-28 NOTE — Progress Notes (Signed)
[facility]  PATIENT: Danielle York DOB: August 23, 1990  REFERRING DOCTOR OR PCP: None SOURCE: Patient, notes from recent hospitalization, imaging and lab reports, MRI images personally reviewed.  _________________________________   HISTORICAL  CHIEF COMPLAINT:  Chief Complaint  Patient presents with   New Patient (Initial Visit)    Room 2. Patient is alone. She was recently seen in hospital and was told she had MS.     HISTORY OF PRESENT ILLNESS:  I had the pleasure of seeing your patient, Danielle York, at the [facility] for neurologic consultation regarding her recent diagnosis of MS.  She is a 30 year old who had the onset of right central vision loss 01/14/21.   Symptoms persisted and she presented to the ED 01/16/2021.   At te worse, she had no central vision (coud not see movement centally) but she was able to see some peripheral vision/movements.   In the ED, she had MRIs showing abnormal signal with enhancement involving the right optic nerve.  Additionally, she had a T2/flair hyperintense focus in the right anterolateral pons and some scattered foci in the periventricular and deep white matter of the cerebral hemispheres.  One of the foci in the right periventricular white matter had enhancement, consistent with a more subacute focus.   She was admitted to the hospital and had 5 days of IV Solumedrol.   Vision was slightly improving by the 4th day.    Vision continued to improve after she had 5 additional days of plasmapheresis.   She feels visual acuity has improved a lot, amost baseline.  She denies any difficulty with gait or balance.   She goes downstairs without using the bannister.  Walking is unlimited, the same as those around her.    She denies any weakness or numbness.    Bladder is fine.   She has some fatigue, a little more than last year.    She sleeps well most nights.   She denies difficulties with mood of cognition.      She may have had a grandparent with MS but is uncertain.     Imaging: MRI orbit 01/17/2021 shows enlargement of the right optic nerve and enhancement consistent with optic neuritis.  MRI brain 01/17/2021 showed T2/flair hyperintense foci in the right anterolateral pons, left cerebral peduncle, and in the periventricular, juxtacortical and deep white matter of the hemispheres.  1 focus in the right periventricular white matter (on the repeat brain MRI) showed mild enhancement consistent with a subacute demyelinating plaque   Labs: August 2022: Lyme disease negative, HIV negative, anti-NMO negative, anti-MOG negative,ACE negative, B12 normal, CBC and CMP were noncontributory.  REVIEW OF SYSTEMS: Constitutional: No fevers, chills, sweats, or change in appetite Eyes: No visual changes, double vision, eye pain Ear, nose and throat: No hearing loss, ear pain, nasal congestion, sore throat Cardiovascular: No chest pain, palpitations Respiratory:  No shortness of breath at rest or with exertion.   No wheezes GastrointestinaI: No nausea, vomiting, diarrhea, abdominal pain, fecal incontinence Genitourinary:  No dysuria, urinary retention or frequency.  No nocturia. Musculoskeletal:  No neck pain, back pain Integumentary: No rash, pruritus, skin lesions Neurological: as above Psychiatric: No depression at this time.  No anxiety Endocrine: No palpitations, diaphoresis, change in appetite, change in weigh or increased thirst Hematologic/Lymphatic:  No anemia, purpura, petechiae. Allergic/Immunologic: No itchy/runny eyes, nasal congestion, recent allergic reactions, rashes  ALLERGIES: Allergies  Allergen Reactions   Amoxicillin  HOME MEDICATIONS:  Current Outpatient Medications:    cholecalciferol (VITAMIN D) 25 MCG tablet, Take 1 tablet (1,000 Units total) by mouth daily., Disp: , Rfl:    Multiple Vitamin (MULTIVITAMIN WITH MINERALS) TABS tablet, Take 1 tablet by mouth daily., Disp: , Rfl:     vitamin B-12 (CYANOCOBALAMIN) 500 MCG tablet, Take 1 tablet (500 mcg total) by mouth daily. (Patient not taking: Reported on 02/28/2021), Disp: , Rfl:   PAST MEDICAL HISTORY: No past medical history on file.  PAST SURGICAL HISTORY: Past Surgical History:  Procedure Laterality Date   IR FLUORO GUIDE CV LINE RIGHT  01/21/2021   IR REMOVAL TUN CV CATH W/O FL  01/28/2021   IR US GUIDE VASC ACCESS RIGHT  01/21/2021   TONSILLECTOMY      FAMILY HISTORY: Family History  Problem Relation Age of Onset   Multiple sclerosis Other     SOCIAL HISTORY:  Social History   Socioeconomic History   Marital status: Single    Spouse name: Not on file   Number of children: Not on file   Years of education: Not on file   Highest education level: Not on file  Occupational History   Not on file  Tobacco Use   Smoking status: Never   Smokeless tobacco: Never  Vaping Use   Vaping Use: Never used  Substance and Sexual Activity   Alcohol use: Yes    Alcohol/week: 4.0 standard drinks    Types: 4 Standard drinks or equivalent per week   Drug use: Never   Sexual activity: Not Currently  Other Topics Concern   Not on file  Social History Narrative   Not on file   Social Determinants of Health   Financial Resource Strain: Not on file  Food Insecurity: Not on file  Transportation Needs: Not on file  Physical Activity: Not on file  Stress: Not on file  Social Connections: Not on file  Intimate Partner Violence: Not on file     PHYSICAL EXAM  Vitals:   02/28/21 0847  BP: 115/79  Pulse: 86  Weight: 154 lb (69.9 kg)  Height: 5\' 4"  (1.626 m)    Body mass index is 26.43 kg/m.  Vision:   OD 20/40     OS 20/25   Colors slightly asymmetric.  General: The patient is well-developed and well-nourished and in no acute distress  HEENT:  Head is [place]/AT.  Sclera are anicteric.  Funduscopic exam shows normal optic discs and retinal vessels.  Neck: No carotid bruits are noted.  The neck is  nontender.  Cardiovascular: The heart has a regular rate and rhythm with a normal S1 and S2. There were no murmurs, gallops or rubs.    Skin: Extremities are without rash or  edema.  Musculoskeletal:  Back is nontender  Neurologic Exam  Mental status: The patient is alert and oriented x 3 at the time of the examination. The patient has apparent normal recent and remote memory, with an apparently normal attention span and concentration ability.   Speech is normal.  Cranial nerves: Extraocular movements are full. Pupils are equal, round, and reactive to light and accomodation.  Facial symmetry is present. There is good facial sensation to soft touch bilaterally.Facial strength is normal.  Trapezius and sternocleidomastoid strength is normal. No dysarthria is noted.  The tongue is midline, and the patient has symmetric elevation of the soft palate. No obvious hearing deficits are noted.  Motor:  Muscle bulk is normal.   Tone is normal.  Strength is  5 / 5 in all 4 extremities.   Sensory: Sensory testing is intact to pinprick, soft touch and vibration sensation in all 4 extremities.  Coordination: Cerebellar testing reveals good finger-nose-finger and heel-to-shin bilaterally.  Gait and station: Station is normal.   Gait is normal. Tandem gait is normal. Romberg is negative.   Reflexes: Deep tendon reflexes are symmetric and normal in arms but increased in legs (spread at knees and 2 beats nonsustained clonus bilateral)..   Plantar responses are flexor.    DIAGNOSTIC DATA (LABS, IMAGING, TESTING) - I reviewed patient records, labs, notes, testing and imaging myself where available.  Lab Results  Component Value Date   WBC 5.9 01/26/2021   HGB 12.9 01/26/2021   HCT 38.0 01/26/2021   MCV 83.0 01/26/2021   PLT 194 01/26/2021      Component Value Date/Time   NA 139 01/26/2021 1719   K 3.9 01/26/2021 1719   CL 97 (L) 01/26/2021 1719   CO2 30 01/26/2021 0223   GLUCOSE 113 (H)  01/26/2021 1719   BUN 6 01/26/2021 1719   CREATININE 0.90 01/26/2021 1719   CALCIUM 8.6 (L) 01/26/2021 0223   PROT 4.8 (L) 01/26/2021 0223   ALBUMIN 3.6 01/26/2021 0223   AST 21 01/26/2021 0223   ALT 25 01/26/2021 0223   ALKPHOS 14 (L) 01/26/2021 0223   BILITOT 0.5 01/26/2021 0223   GFRNONAA >60 01/26/2021 0223   No results found for: CHOL, HDL, LDLCALC, LDLDIRECT, TRIG, CHOLHDL No results found for: [id number] Lab Results  Component Value Date   VITAMINB12 408 01/17/2021   No results found for: TSH     ASSESSMENT AND PLAN  Multiple sclerosis (HCC) - Plan: Hepatitis B core antibody, total, Hepatitis B surface antigen, IgG, IgA, IgM, QuantiFERON-TB Gold Plus, Stratify JCV Antibody Test ([facility]), Varicella zoster antibody, IgG, Hepatitis C antibody, Hepatitis B surface antibody,qualitative  Optic neuritis  High risk medication use - Plan: Hepatitis B core antibody, total, Hepatitis B surface antigen, IgG, IgA, IgM, QuantiFERON-TB Gold Plus, Stratify JCV Antibody Test (Quest), Varicella zoster antibody, IgG, Hepatitis C antibody, Hepatitis B surface antibody,qualitative   In summary, Ms. Arentz is a 30 year old with a new diagnosis of multiple sclerosis after presenting in late July 2022 with right optic neuritis.  She meets the McDonald criteria for definite also having another enhancing lesion in the brain.  I discussed the importance of getting started on a disease modifying therapy.  She felt a little bit overwhelmed during the visit.  We spent the most time discussing 3 treatments: Vumerity, Ocrevus and Zeposia.  She is concerned about safety aspects of treatment.  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  If we have difficulty with patient assistance (she does not have insurance) we will need to consider a different option such as Vumerity or Zeposia.  Of note, we are  participating in an open label study of the Zeposia to gain additional cognitive data that she would likely qualify for.  We will check necessary blood work for the above options.  She will continue taking vitamin D supplements.  We also discussed the importance of a healthy diet-heart healthy is also brain healthy.  Additionally, staying active and trying to lose weight.  She will return to see me in 3 months or sooner if there are new or worsening neurologic symptoms.  Thank you for asking me  to see Ms. Montez Morita.  Please let her know to be of further assistance with her or other patients in the future.    Souleymane Saiki A. Epimenio Foot, MD, [facility] 02/28/2021, 9:22 AM Certified in Neurology, Clinical Neurophysiology, Sleep Medicine and Neuroimaging  [facility] [address] [place], [place] [address] [phone]

## 2021-03-02 ENCOUNTER — Telehealth: Payer: Self-pay

## 2021-03-02 NOTE — Telephone Encounter (Signed)
I called patient.  I discussed her lab results and recommendations.  Patient would prefer to get vaccinated against chickenpox so she will be able to use Ocrevus or Zeposia.  Patient will contact her PCP to get the chickenpox vaccine.  I will check on her next week.

## 2021-03-02 NOTE — Telephone Encounter (Signed)
-----   Message from Asa Lente, MD sent at 03/01/2021  6:03 PM EDT ----- Please let her know that the lab work showed that she does not have antibodies protecting against the varicella virus (the virus that causes chickenpox and shingles).  Some of the medications we use for MS can increase the risk of these type of infections in people who do not have protection against them.  We had discussed a couple medications when she was here.  We could start the Vumerity but we would not be able to use Ocrevus or Zeposia unless she gets vaccinated against chickenpox.

## 2021-03-03 LAB — HEPATITIS B SURFACE ANTIBODY,QUALITATIVE: Hep B Surface Ab, Qual: NONREACTIVE

## 2021-03-03 LAB — QUANTIFERON-TB GOLD PLUS
QuantiFERON Mitogen Value: >10 IU/mL
QuantiFERON Nil Value: 0.02 IU/mL
QuantiFERON TB1 Ag Value: 0.02 IU/mL
QuantiFERON TB2 Ag Value: 0.03 IU/mL
QuantiFERON-TB Gold Plus: NEGATIVE

## 2021-03-03 LAB — IGG, IGA, IGM
IgA/Immunoglobulin A, Serum: 117 mg/dL (ref 87–352)
IgG (Immunoglobin G), Serum: 813 mg/dL (ref 586–1602)
IgM (Immunoglobulin M), Srm: 215 mg/dL (ref 26–217)

## 2021-03-03 LAB — HEPATITIS C ANTIBODY: Hep C Virus Ab: <0.1 s/co ratio (ref 0.0–0.9)

## 2021-03-03 LAB — HEPATITIS B CORE ANTIBODY, TOTAL: Hep B Core Total Ab: NEGATIVE

## 2021-03-03 LAB — HEPATITIS B SURFACE ANTIGEN: Hepatitis B Surface Ag: NEGATIVE

## 2021-03-03 LAB — VARICELLA ZOSTER ANTIBODY, IGG: Varicella zoster IgG: <135 index — ABNORMAL LOW (ref >165)

## 2021-03-08 NOTE — Telephone Encounter (Signed)
I spoke with Dr. Epimenio Foot. Since patient is not on a DMT currently, she can get either Zostavax and then wait three weeks before starting Ocrevus, or get Shingrix and wait 1 week.  I called patient again to discuss. No answer, VM full.

## 2021-03-09 NOTE — Telephone Encounter (Signed)
JCV ab collected on 02/28/21 positive, index: 1.44. Gave to MD for review.

## 2021-03-10 NOTE — Telephone Encounter (Signed)
Attempted to call the patient to review lab results. There was no answer and VM was full.

## 2021-03-10 NOTE — Telephone Encounter (Signed)
-----   Message from Asa Lente, MD sent at 03/10/2021  9:04 AM EDT ----- She is JCV Ab positive and VZV IgG negative - therefore not a candidate for Tysabri or for Zeposia/Mayzernt/GIlenya.     Vumerity is probably best option as she does not have insurance

## 2021-03-14 NOTE — Telephone Encounter (Signed)
I called patient again to discuss. No answer, VM full. Will try again later. 

## 2021-03-15 NOTE — Telephone Encounter (Signed)
I called patient again to discuss. No answer, VM full. This is our third unsuccessful attempt at reaching patient via phone. Will send her a [employer].

## 2021-03-21 NOTE — Telephone Encounter (Signed)
I called patient again to discuss. No answer, VM is full. This is my fourth unsuccessful attempt at reaching patient via phone. She has not read the [employer]. I will send her a letter asking her to call us back.

## 2021-04-04 NOTE — Telephone Encounter (Signed)
Patient returned my call. She has not gotten the shingles vaccine. She is uninsured and does not have a PCP.  She only wants a DMT that is an IV medication or that is oral once daily.  She does not want to take Vumerity since it is 2 caps BID.  She will try to get a Shingrix or Zostavax at a local pharmacy and let me know when this is completed.

## 2021-05-04 NOTE — Telephone Encounter (Signed)
I called patient again to discuss. No answer, no VM set up. I have asked the patient to let me know when she has received a Shingrix or Zostavax vaccine but she has not contacted me.
